# Patient Record
Sex: Male | Born: 1950 | Race: White | Hispanic: No | Marital: Single | Smoking: Former smoker
Health system: Southern US, Community
[De-identification: ages and names within clinical notes are randomized; demographics above are authoritative.]

## PROBLEM LIST (undated history)

## (undated) DIAGNOSIS — Z9889 Other specified postprocedural states: Secondary | ICD-10-CM

## (undated) DIAGNOSIS — N529 Male erectile dysfunction, unspecified: Secondary | ICD-10-CM

## (undated) DIAGNOSIS — K219 Gastro-esophageal reflux disease without esophagitis: Secondary | ICD-10-CM

## (undated) DIAGNOSIS — M1990 Unspecified osteoarthritis, unspecified site: Secondary | ICD-10-CM

## (undated) DIAGNOSIS — E291 Testicular hypofunction: Secondary | ICD-10-CM

## (undated) DIAGNOSIS — N138 Other obstructive and reflux uropathy: Secondary | ICD-10-CM

## (undated) DIAGNOSIS — N401 Enlarged prostate with lower urinary tract symptoms: Secondary | ICD-10-CM

## (undated) DIAGNOSIS — Z87442 Personal history of urinary calculi: Secondary | ICD-10-CM

## (undated) DIAGNOSIS — E559 Vitamin D deficiency, unspecified: Secondary | ICD-10-CM

## (undated) DIAGNOSIS — G709 Myoneural disorder, unspecified: Secondary | ICD-10-CM

## (undated) DIAGNOSIS — J45909 Unspecified asthma, uncomplicated: Secondary | ICD-10-CM

## (undated) HISTORY — PX: ANTERIOR RELEASE VERTEBRAL BODY W/ POSTERIOR FUSION: SUR290

## (undated) HISTORY — PX: APPENDECTOMY: SHX54

## (undated) HISTORY — PX: HERNIA REPAIR: SHX51

## (undated) HISTORY — DX: Male erectile dysfunction, unspecified: N52.9

## (undated) HISTORY — DX: Other obstructive and reflux uropathy: N13.8

## (undated) HISTORY — PX: NECK SURGERY: SHX720

## (undated) HISTORY — DX: Vitamin D deficiency, unspecified: E55.9

## (undated) HISTORY — PX: VARICOCELECTOMY: SHX1084

## (undated) HISTORY — PX: OTHER SURGICAL HISTORY: SHX169

## (undated) HISTORY — DX: Benign prostatic hyperplasia with lower urinary tract symptoms: N40.1

## (undated) HISTORY — DX: Testicular hypofunction: E29.1

---

## 2006-12-03 ENCOUNTER — Emergency Department: Payer: Self-pay | Admitting: Emergency Medicine

## 2006-12-03 ENCOUNTER — Other Ambulatory Visit: Payer: Self-pay

## 2007-02-17 ENCOUNTER — Ambulatory Visit: Payer: Self-pay | Admitting: Emergency Medicine

## 2010-06-26 HISTORY — PX: LEG SURGERY: SHX1003

## 2012-04-09 ENCOUNTER — Ambulatory Visit: Payer: Self-pay | Admitting: Surgery

## 2012-04-11 LAB — PATHOLOGY REPORT

## 2013-06-17 ENCOUNTER — Ambulatory Visit: Payer: Self-pay | Admitting: Family Medicine

## 2015-01-04 ENCOUNTER — Telehealth: Payer: Self-pay

## 2015-01-04 NOTE — Telephone Encounter (Signed)
Pt came into office wanting PA done. Nurse called insurance co and was able to get medication approved for 12/14/14-01/04/16. Pt was made aware. Cw,lpn

## 2015-05-04 ENCOUNTER — Other Ambulatory Visit: Payer: Self-pay

## 2015-05-04 ENCOUNTER — Encounter: Payer: Self-pay | Admitting: *Deleted

## 2015-05-04 DIAGNOSIS — E291 Testicular hypofunction: Secondary | ICD-10-CM

## 2015-05-04 DIAGNOSIS — R972 Elevated prostate specific antigen [PSA]: Secondary | ICD-10-CM

## 2015-05-05 LAB — PSA: PROSTATE SPECIFIC AG, SERUM: 3.3 ng/mL (ref 0.0–4.0)

## 2015-05-05 LAB — TESTOSTERONE: Testosterone: 343 ng/dL — ABNORMAL LOW (ref 348–1197)

## 2015-05-06 ENCOUNTER — Ambulatory Visit (INDEPENDENT_AMBULATORY_CARE_PROVIDER_SITE_OTHER): Payer: BC Managed Care – PPO | Admitting: Urology

## 2015-05-06 ENCOUNTER — Encounter: Payer: Self-pay | Admitting: Urology

## 2015-05-06 VITALS — BP 138/73 | HR 60 | Ht 68.0 in | Wt 204.8 lb

## 2015-05-06 DIAGNOSIS — N138 Other obstructive and reflux uropathy: Secondary | ICD-10-CM

## 2015-05-06 DIAGNOSIS — E291 Testicular hypofunction: Secondary | ICD-10-CM

## 2015-05-06 DIAGNOSIS — N401 Enlarged prostate with lower urinary tract symptoms: Secondary | ICD-10-CM

## 2015-05-06 DIAGNOSIS — N529 Male erectile dysfunction, unspecified: Secondary | ICD-10-CM | POA: Diagnosis not present

## 2015-05-06 MED ORDER — CIALIS 5 MG PO TABS: 5.0000 mg | ORAL_TABLET | Freq: Every day | ORAL | Status: DC

## 2015-05-06 MED ORDER — CIALIS 5 MG PO TABS
5.0000 mg | ORAL_TABLET | Freq: Every day | ORAL | Status: DC
Start: 2015-05-06 — End: 2015-05-06

## 2015-05-06 NOTE — Progress Notes (Signed)
05/06/2015 8:53 AM   Craig [name] 03-01-1951 [id number]  Referring provider: No referring provider defined for this encounter.  Chief Complaint  Patient presents with  . Benign Prostatic Hypertrophy    6 month follow up  . Erectile Dysfunction    HPI: Patient is a 64 year old white male with hypogonadism, ED and BPH with LUTS who presents today for a 6 months follow up.  Hypogonadism Patient presented with the symptoms of reduced libido and erectile dysfunction.   He is also experiencing a reduced incidence of spontaneous erections, an increase of body fat, a decrease in physical and work performance, disturbances in sleep patterns, decreased energy and motivation, a decrease in cognitive function and mood changes.   He had been on AndroGel in the past, but he felt no improvement in his symptoms. He does not want to start his testosterone therapy until his symptoms are much worse.  Erectile dysfunction His SHIM score is 24, which is no ED.   He has been having difficulty with erections for the last few years.   His major complaint is maintaining.  His libido is preserved.   His risk factors for ED are age and  BPH.  He denies any painful erections or curvatures with his erections.   He has tried PDE5-inhibitors in the past with good results.        SHIM      05/06/15 1430       SHIM: Over the last 6 months:   How do you rate your confidence that you could get and keep an erection? High     When you had erections with sexual stimulation, how often were your erections hard enough for penetration (entering your partner)? Almost Always or Always     During sexual intercourse, how often were you able to maintain your erection after you had penetrated (entered) your partner? Not Difficult     During sexual intercourse, how difficult was it to maintain your erection to completion of intercourse? Not Difficult     When you attempted sexual intercourse, how often was it satisfactory  for you? Not Difficult     SHIM Total Score   SHIM 24        Score: 1-7 Severe ED 8-11 Moderate ED 12-16 Mild-Moderate ED 17-21 Mild ED 22-25 No ED  BPH WITH LUTS His IPSS score today is 20, which is severe lower urinary tract symptomatology. He is mostly satisfied with his quality life due to his urinary symptoms.  His major complaint today getting older.  He has had these symptoms for last  year.  He denies any dysuria, hematuria or suprapubic pain.  He currently taking tamsulosin 0.4 mg daily.  He also denies any recent fevers, chills, nausea or vomiting.  He does not have a family history of PCa.      IPSS      05/06/15 1400       International Prostate Symptom Score   How often have you had the sensation of not emptying your bladder? More than half the time     How often have you had to urinate less than every two hours? Less than half the time     How often have you found you stopped and started again several times when you urinated? Less than half the time     How often have you found it difficult to postpone urination? Almost always     How often have you had a weak  urinary stream? More than half the time     How often have you had to strain to start urination? Not at All     How many times did you typically get up at night to urinate? 3 Times     Total IPSS Score 20     Quality of Life due to urinary symptoms   If you were to spend the rest of your life with your urinary condition just the way it is now how would you feel about that? Mostly Satisfied        Score:  1-7 Mild 8-19 Moderate 20-35 Severe    PMH: Past Medical History  Diagnosis Date  . Hypogonadism in male   . BPH with obstruction/lower urinary tract symptoms   . Vitamin D deficiency   . Erectile dysfunction     Surgical History: Past Surgical History  Procedure Laterality Date  . Anterior release vertebral body w/ posterior fusion    . Leg surgery Right 2012  . Appendectomy    .  Varicocelectomy    . Hernia repair  1975/1960  . Ruptured disc    . Neck surgery      Home Medications:    Medication List       This list is accurate as of: 05/06/15 11:59 PM.  Always use your most recent med list.               ANDROGEL PUMP 20.25 MG/ACT (1.62%) Gel  Generic drug:  Testosterone     CIALIS 5 MG tablet  Generic drug:  tadalafil  Take 1 tablet (5 mg total) by mouth daily.     ibuprofen 200 MG tablet  Commonly known as:  ADVIL,MOTRIN  Take 200 mg by mouth every 6 (six) hours as needed.     tamsulosin 0.4 MG Caps capsule  Commonly known as:  FLOMAX  Take by mouth.        Allergies:  Allergies  Allergen Reactions  . Other Other (See Comments)    IVP dye - SEVERE, Hospitalized, Profused Sweating    Family History: Family History  Problem Relation Age of Onset  . Kidney cancer Neg Hx   . Prostate cancer Neg Hx     Social History:  reports that he has quit smoking. He does not have any smokeless tobacco history on file. He reports that he drinks alcohol. He reports that he does not use illicit drugs.  ROS: UROLOGY Frequent Urination?: Yes Hard to postpone urination?: Yes Burning/pain with urination?: No Get up at night to urinate?: Yes Leakage of urine?: Yes Urine stream starts and stops?: No Trouble starting stream?: No Do you have to strain to urinate?: No Blood in urine?: No Urinary tract infection?: No Sexually transmitted disease?: No Injury to kidneys or bladder?: No Painful intercourse?: No Weak stream?: No Erection problems?: No Penile pain?: No  Gastrointestinal Nausea?: No Vomiting?: No Indigestion/heartburn?: No Diarrhea?: No Constipation?: No  Constitutional Fever: No Night sweats?: No Weight loss?: No Fatigue?: No  Skin Skin rash/lesions?: No Itching?: No  Eyes Blurred vision?: No Double vision?: No  Ears/Nose/Throat Sore throat?: No Sinus problems?: No  Hematologic/Lymphatic Swollen glands?:  No Easy bruising?: No  Cardiovascular Leg swelling?: No Chest pain?: No  Respiratory Cough?: No Shortness of breath?: No  Endocrine Excessive thirst?: No  Musculoskeletal Back pain?: No Joint pain?: No  Neurological Headaches?: No Dizziness?: No  Psychologic Depression?: No Anxiety?: No  Physical Exam: BP 138/73 mmHg  Pulse 60  Ht 5\' 8"  (1.727 m)  Wt 204 lb 12.8 oz (92.897 kg)  BMI 31.15 kg/m2  GU: No CVA tenderness.  No bladder fullness or masses.  Patient with circumcised phallus.   Urethral meatus is patent.  No penile discharge. No penile lesions or rashes. Scrotum without lesions, cysts, rashes and/or edema.  Testicles are located scrotally bilaterally. No masses are appreciated in the testicles. Left and right epididymis are normal. Rectal: Patient with  normal sphincter tone. Anus and perineum without scarring or rashes. No rectal masses are appreciated. Prostate is approximately 45 grams, no nodules are appreciated. Seminal vesicles are normal.   Laboratory Data: PSA History:  2.7 ng/mL on 11/04/2013  3.2 ng/mL on 04/06/2014  3.2 mg/mL on 11/05/2014  Lab Results  Component Value Date   PSA 3.3 05/04/2015    Lab Results  Component Value Date   TESTOSTERONE 343* 05/04/2015    Assessment & Plan:    1. Hypogonadism:   Patient had been on AndroGel therapy in the past, but he did not feel any improvement in his energy [name] or mood. He does not want to restart any testosterone therapy at this time.  2. Erectile dysfunction, unspecified erectile dysfunction type:   SHIM score is 24.  He is finding the Cialis 5 mg daily effective in treating his erectile dysfunction. I have sent a refill to his pharmacy. He will return to the office in 1 year for [facility] IM score and exam.  3. BPH (benign prostatic hyperplasia) with LUTS:   IPSS score is 20/2.  He is finding the Cialis 5 mg daily effective in treating his lower urinary tract symptoms. He contributes most of his  symptoms at this time to his increase in age and does not want to add additional medications or undergo any surgical procedure to improve his symptoms at this time. He'll return in 1 year for I PSS score, exam and PSA.   Return in about 1 year (around 05/05/2016) for IPSS and SHIM.  Zara Council, [facility] [address] [place],  [address] [phone]

## 2015-05-08 DIAGNOSIS — N138 Other obstructive and reflux uropathy: Secondary | ICD-10-CM | POA: Insufficient documentation

## 2015-05-08 DIAGNOSIS — N529 Male erectile dysfunction, unspecified: Secondary | ICD-10-CM | POA: Insufficient documentation

## 2015-05-08 DIAGNOSIS — N401 Enlarged prostate with lower urinary tract symptoms: Principal | ICD-10-CM

## 2016-01-20 ENCOUNTER — Telehealth: Payer: Self-pay

## 2016-01-20 NOTE — Telephone Encounter (Signed)
PA for cialis 5mg  was APPROVED for 29mo. Approval #- [id number]

## 2016-05-01 ENCOUNTER — Other Ambulatory Visit: Payer: Self-pay

## 2016-05-01 ENCOUNTER — Other Ambulatory Visit: Payer: Medicare Other

## 2016-05-01 ENCOUNTER — Other Ambulatory Visit: Payer: BC Managed Care – PPO

## 2016-05-01 DIAGNOSIS — N401 Enlarged prostate with lower urinary tract symptoms: Secondary | ICD-10-CM

## 2016-05-02 LAB — PSA: Prostate Specific Ag, Serum: 3.5 ng/mL (ref 0.0–4.0)

## 2016-05-08 ENCOUNTER — Ambulatory Visit (INDEPENDENT_AMBULATORY_CARE_PROVIDER_SITE_OTHER): Payer: Medicare Other | Admitting: Urology

## 2016-05-08 ENCOUNTER — Other Ambulatory Visit: Payer: Self-pay | Admitting: Family Medicine

## 2016-05-08 ENCOUNTER — Encounter: Payer: Self-pay | Admitting: Urology

## 2016-05-08 VITALS — BP 118/70 | HR 57 | Ht 68.0 in | Wt 202.9 lb

## 2016-05-08 DIAGNOSIS — N138 Other obstructive and reflux uropathy: Secondary | ICD-10-CM

## 2016-05-08 DIAGNOSIS — N401 Enlarged prostate with lower urinary tract symptoms: Secondary | ICD-10-CM

## 2016-05-08 DIAGNOSIS — N529 Male erectile dysfunction, unspecified: Secondary | ICD-10-CM | POA: Diagnosis not present

## 2016-05-08 MED ORDER — CIALIS 5 MG PO TABS: 5.0000 mg | ORAL_TABLET | Freq: Every day | ORAL | 4 refills | Status: DC

## 2016-05-08 NOTE — Progress Notes (Signed)
05/08/2016 2:05 PM   Craig [name] [birth year] [id number]  Referring provider: No referring provider defined for this encounter.  Chief Complaint  Patient presents with  . Benign Prostatic Hypertrophy    1 year follow up  . Erectile Dysfunction    HPI: Patient is a 65 year old Caucasian male with ED and BPH with LUTS who presents today for a 12 months follow up.  Erectile dysfunction His SHIM score is 19, which is mild ED.   His previous SHIM score is 24.  He has been having difficulty with erections for the last few years.   His major complaint is maintaining.  His libido is preserved.   His risk factors for ED are age and  BPH.  He denies any painful erections or curvatures with his erections.   He has tried PDE5-inhibitors in the past with good results.        SHIM    Row Name 05/08/16 1338         SHIM: Over the last 6 months:   How do you rate your confidence that you could get and keep an erection? Moderate     When you had erections with sexual stimulation, how often were your erections hard enough for penetration (entering your partner)? Most Times (much more than half the time)     During sexual intercourse, how often were you able to maintain your erection after you had penetrated (entered) your partner? Slightly Difficult     During sexual intercourse, how difficult was it to maintain your erection to completion of intercourse? Slightly Difficult     When you attempted sexual intercourse, how often was it satisfactory for you? Slightly Difficult       SHIM Total Score   SHIM 19        Score: 1-7 Severe ED 8-11 Moderate ED 12-16 Mild-Moderate ED 17-21 Mild ED 22-25 No ED  BPH WITH LUTS His IPSS score today is 18, which is moderate lower urinary tract symptomatology.  He is mixed with his quality life due to his urinary symptoms.  His PVR is His previous IPSS score was 20/2.  His major complaint today is taking longer to empty his bladder.  He has had  these symptoms for last  year.  He denies any dysuria, hematuria or suprapubic pain.  He currently taking tamsulosin 0.4 mg daily.  He also denies any recent fevers, chills, nausea or vomiting.  He does not have a family history of PCa.      IPSS    Row Name 05/08/16 1300         International Prostate Symptom Score   How often have you had the sensation of not emptying your bladder? About half the time     How often have you had to urinate less than every two hours? Less than half the time     How often have you found you stopped and started again several times when you urinated? Less than 1 in 5 times     How often have you found it difficult to postpone urination? More than half the time     How often have you had a weak urinary stream? Almost always     How often have you had to strain to start urination? Not at All     How many times did you typically get up at night to urinate? 3 Times     Total IPSS Score 18  Quality of Life due to urinary symptoms   If you were to spend the rest of your life with your urinary condition just the way it is now how would you feel about that? Mixed        Score:  1-7 Mild 8-19 Moderate 20-35 Severe  PMH: Past Medical History:  Diagnosis Date  . BPH with obstruction/lower urinary tract symptoms   . Erectile dysfunction   . Hypogonadism in male   . Vitamin D deficiency     Surgical History: Past Surgical History:  Procedure Laterality Date  . ANTERIOR RELEASE VERTEBRAL BODY W/ POSTERIOR FUSION    . APPENDECTOMY    . HERNIA REPAIR  1975/1960  . LEG SURGERY Right 2012  . NECK SURGERY    . ruptured disc    . VARICOCELECTOMY      Home Medications:    Medication List       Accurate as of 05/08/16  2:05 PM. Always use your most recent med list.          CIALIS 5 MG tablet Generic drug:  tadalafil Take 1 tablet (5 mg total) by mouth daily.   ibuprofen 200 MG tablet Commonly known as:  ADVIL,MOTRIN Take 200 mg by mouth  3 (three) times daily.   tamsulosin 0.4 MG Caps capsule Commonly known as:  FLOMAX Take by mouth.   Testosterone 20.25 MG/ACT (1.62%) Gel Reported on 11/05/2015       Allergies:  Allergies  Allergen Reactions  . Other Other (See Comments)    IVP dye - SEVERE, Hospitalized, Profused Sweating    Family History: Family History  Problem Relation Age of Onset  . Kidney cancer Neg Hx   . Prostate cancer Neg Hx   . Kidney disease Neg Hx     Social History:  reports that he has quit smoking. He has never used smokeless tobacco. He reports that he drinks alcohol. He reports that he does not use drugs.  ROS: UROLOGY Frequent Urination?: Yes Hard to postpone urination?: Yes Burning/pain with urination?: No Get up at night to urinate?: Yes Leakage of urine?: Yes Urine stream starts and stops?: No Trouble starting stream?: No Do you have to strain to urinate?: No Blood in urine?: No Urinary tract infection?: No Sexually transmitted disease?: No Injury to kidneys or bladder?: No Painful intercourse?: No Weak stream?: No Erection problems?: No Penile pain?: No  Gastrointestinal Nausea?: No Vomiting?: No Indigestion/heartburn?: No Diarrhea?: No Constipation?: No  Constitutional Fever: No Night sweats?: No Weight loss?: No Fatigue?: No  Skin Skin rash/lesions?: No Itching?: No  Eyes Blurred vision?: No Double vision?: No  Ears/Nose/Throat Sore throat?: No Sinus problems?: No  Hematologic/Lymphatic Swollen glands?: No Easy bruising?: No  Cardiovascular Leg swelling?: No Chest pain?: No  Respiratory Cough?: No Shortness of breath?: No  Endocrine Excessive thirst?: No  Musculoskeletal Back pain?: Yes Joint pain?: Yes  Neurological Headaches?: No Dizziness?: No  Psychologic Depression?: No Anxiety?: No  Physical Exam: BP 118/70   Pulse (!) 57   Ht 5\' 8"  (1.727 m)   Wt 202 lb 14.4 oz (92 kg)   BMI 30.85 kg/m   GU: No CVA tenderness.   No bladder fullness or masses.  Patient with circumcised phallus.   Urethral meatus is patent.  No penile discharge. No penile lesions or rashes. Scrotum without lesions, cysts, rashes and/or edema.  Testicles are located scrotally bilaterally. No masses are appreciated in the testicles. Left and right epididymis are normal. Rectal: Patient  with  normal sphincter tone. Anus and perineum without scarring or rashes. No rectal masses are appreciated. Prostate is approximately 45 grams, no nodules are appreciated. Seminal vesicles are normal.   Laboratory Data: PSA History:  2.7 ng/mL on 11/04/2013  3.2 ng/mL on 04/06/2014  3.2 ng/mL on 11/05/2014  3.5 ng/mL on 05/01/2016   Lab Results  Component Value Date   TESTOSTERONE 343 (L) 05/04/2015    Assessment & Plan:    1. Erectile dysfunction  - SHIM score is 19, it is worsening  - I explained to the patient that in order to achieve an erection it takes good functioning of the nervous system (parasympathetic, sympathetic, sensory and motor), good blood flow into the erectile tissue of the penis and a desire to have sex  - I explained that conditions like diabetes, hypertension, coronary artery disease, peripheral vascular disease, smoking, alcohol consumption, age, sleep apnea and BPH can diminish the ability to have an erection  - Continue Cialis  - RTC in 12 months for repeat SHIM score and exam   2. BPH with LUTS  - IPSS score is 18/3, it is worsening  - Continue conservative management, avoiding bladder irritants and timed voiding's  - Continue Cialis 5 mg daily; refill given   - RTC in 12 months for IPSS, PSA and exam  Return in about 1 year (around 05/08/2017) for IPSS, SHIM, PSA and exam.  Zara Council, [facility]  [place] [address], [place], [place] [address] [phone]

## 2016-05-15 ENCOUNTER — Telehealth: Payer: Self-pay

## 2016-05-15 NOTE — Telephone Encounter (Signed)
PA for Cialis was DENIED!

## 2016-11-15 DIAGNOSIS — M129 Arthropathy, unspecified: Secondary | ICD-10-CM | POA: Insufficient documentation

## 2016-11-15 DIAGNOSIS — K219 Gastro-esophageal reflux disease without esophagitis: Secondary | ICD-10-CM | POA: Insufficient documentation

## 2017-05-10 NOTE — Progress Notes (Signed)
05/11/2017 9:03 AM   Craig [name] Aug 09, 1950 [id number]  Referring provider: No referring provider defined for this encounter.  Chief Complaint  Patient presents with  . Benign Prostatic Hypertrophy    HPI: Patient is a 66 year old Caucasian male with ED and BPH with LUTS who presents today for a 12 months follow up.  Erectile dysfunction His SHIM score is 20, which is mild ED.   His previous SHIM score is 19.  He has been having difficulty with erections for the last few years.   His major complaint is maintaining.  His libido is preserved.   His risk factors for ED are age and  BPH.  He denies any painful erections or curvatures with his erections.   He has tried PDE5-inhibitors in the past with good results.  He has discontinued his Cialis due to cost.   His testosterone [name] and TSH are normal.   SHIM    Row Name 05/11/17 0847         SHIM: Over the last 6 months:   How do you rate your confidence that you could get and keep an erection?  Moderate     When you had erections with sexual stimulation, how often were your erections hard enough for penetration (entering your partner)?  Most Times (much more than half the time)     During sexual intercourse, how often were you able to maintain your erection after you had penetrated (entered) your partner?  Most Times (much more than half the time)     During sexual intercourse, how difficult was it to maintain your erection to completion of intercourse?  Not Difficult     When you attempted sexual intercourse, how often was it satisfactory for you?  Most Times (much more than half the time)       SHIM Total Score   SHIM  20        Score: 1-7 Severe ED 8-11 Moderate ED 12-16 Mild-Moderate ED 17-21 Mild ED 22-25 No ED  BPH WITH LUTS His IPSS score today is 25, which is severe lower urinary tract symptomatology.   He is mixed with his quality life due to his urinary symptoms.  His PVR is His previous IPSS score was 18/3.   His major complaints today are frequency, urgency, nocturia, incontinence (post void), intermittency and a weak stream.  He has had these symptoms for last  year.  He denies any dysuria, hematuria or suprapubic pain.  He has discontinued his tamsulosin 0.4 mg daily and is taking super beta prostate and ageless male.  He also denies any recent fevers, chills, nausea or vomiting.  He does not have a family history of PCa. IPSS    Row Name 05/11/17 0800         International Prostate Symptom Score   How often have you had the sensation of not emptying your bladder?  More than half the time     How often have you had to urinate less than every two hours?  About half the time     How often have you found you stopped and started again several times when you urinated?  Almost always     How often have you found it difficult to postpone urination?  Almost always     How often have you had a weak urinary stream?  Almost always     How often have you had to strain to start urination?  Less than 1 in  5 times     How many times did you typically get up at night to urinate?  2 Times     Total IPSS Score  25       Quality of Life due to urinary symptoms   If you were to spend the rest of your life with your urinary condition just the way it is now how would you feel about that?  Mixed        Score:  1-7 Mild 8-19 Moderate 20-35 Severe  PMH: Past Medical History:  Diagnosis Date  . BPH with obstruction/lower urinary tract symptoms   . Erectile dysfunction   . Hypogonadism in male   . Vitamin D deficiency     Surgical History: Past Surgical History:  Procedure Laterality Date  . ANTERIOR RELEASE VERTEBRAL BODY W/ POSTERIOR FUSION    . APPENDECTOMY    . HERNIA REPAIR  1975/1960  . LEG SURGERY Right 2012  . NECK SURGERY    . ruptured disc    . VARICOCELECTOMY      Home Medications:  Allergies as of 05/11/2017      Reactions   Ivp Dye [iodinated Diagnostic Agents] Anaphylaxis        Medication List        Accurate as of 05/11/17  9:03 AM. Always use your most recent med list.          ibuprofen 200 MG tablet Commonly known as:  ADVIL,MOTRIN Take 200 mg by mouth 3 (three) times daily.   omeprazole 40 MG capsule Commonly known as:  PRILOSEC Take 40 mg daily by mouth.   sildenafil 20 MG tablet Commonly known as:  REVATIO Take 3 to 5 tablets two hours before intercouse on an empty stomach.  Do not take with nitrates.   tadalafil 5 MG tablet Commonly known as:  CIALIS Take 1 tablet (5 mg total) daily as needed by mouth for erectile dysfunction.   UNABLE TO FIND Take 1 capsule 2 (two) times daily by mouth. Med Name: Super Beta Prostate   UNABLE TO FIND Take 1 capsule 2 (two) times daily by mouth. Med Name: Ageless Male       Allergies:  Allergies  Allergen Reactions  . Ivp Dye [Iodinated Diagnostic Agents] Anaphylaxis    Family History: Family History  Problem Relation Age of Onset  . Kidney cancer Neg Hx   . Prostate cancer Neg Hx   . Kidney disease Neg Hx     Social History:  reports that he has quit smoking. he has never used smokeless tobacco. He reports that he drinks alcohol. He reports that he does not use drugs.  ROS: UROLOGY Frequent Urination?: Yes Hard to postpone urination?: Yes Burning/pain with urination?: No Get up at night to urinate?: Yes Leakage of urine?: Yes Urine stream starts and stops?: Yes Trouble starting stream?: No Do you have to strain to urinate?: No Blood in urine?: No Urinary tract infection?: No Sexually transmitted disease?: No Injury to kidneys or bladder?: No Painful intercourse?: No Weak stream?: Yes Erection problems?: No Penile pain?: No  Gastrointestinal Nausea?: No Vomiting?: No Indigestion/heartburn?: No Diarrhea?: No Constipation?: No  Constitutional Fever: No Night sweats?: No Weight loss?: No Fatigue?: No  Skin Skin rash/lesions?: No Itching?: No  Eyes Blurred vision?:  No Double vision?: No  Ears/Nose/Throat Sore throat?: No Sinus problems?: No  Hematologic/Lymphatic Swollen glands?: No Easy bruising?: No  Cardiovascular Leg swelling?: No Chest pain?: No  Respiratory Cough?: No Shortness of breath?:  No  Endocrine Excessive thirst?: No  Musculoskeletal Back pain?: Yes Joint pain?: Yes  Neurological Headaches?: No Dizziness?: No  Psychologic Depression?: No Anxiety?: No  Physical Exam: BP (!) 153/86   Pulse 61   Ht 5\' 8"  (1.727 m)   Wt 207 lb (93.9 kg)   BMI 31.47 kg/m   GU: No CVA tenderness.  No bladder fullness or masses.  Patient with circumcised phallus.   Urethral meatus is patent.  No penile discharge. No penile lesions or rashes. Scrotum without lesions, cysts, rashes and/or edema.  Testicles are located scrotally bilaterally. No masses are appreciated in the testicles. Left and right epididymis are normal. Rectal: Patient with  normal sphincter tone. Anus and perineum without scarring or rashes. No rectal masses are appreciated. Prostate is approximately 45 grams, no nodules are appreciated. Seminal vesicles are normal.   Laboratory Data: PSA History:  2.7 ng/mL on 11/04/2013  3.2 ng/mL on 04/06/2014  3.2 ng/mL on 11/05/2014  3.5 ng/mL on 05/01/2016  3.83 ng/mL on 05/11/2017  I have reviewed the labs.   Assessment & Plan:    1. Erectile dysfunction  - SHIM score is 20, it is mildly improved  - I explained to the patient that in order to achieve an erection it takes good functioning of the nervous system (parasympathetic, sympathetic, sensory and motor), good blood flow into the erectile tissue of the penis and a desire to have sex  - I explained that conditions like diabetes, hypertension, coronary artery disease, peripheral vascular disease, smoking, alcohol consumption, age, sleep apnea and BPH can diminish the ability to have an erection  - prescribed Sildenafil 20 mg, 3 to 5 tablets two hours prior to  intercourse on an empty stomach, # 8; he is warned not to take medications that contain nitrates.  I also advised him of the side effects, such as: headache, flushing, dyspepsia, abnormal vision, nasal congestion, back pain, myalgia, nausea, dizziness, and rash.  - RTC in 12 months for repeat SHIM score and exam   2. BPH with LUTS  - IPSS score is 25/3, it is worsening  - Continue conservative management, avoiding bladder irritants and timed voiding's  - Cialis 5 mg daily was cost prohibitive, will prescribe again to see what may need to be done to get it approved; could not tolerate the side effects of the alpha-blockers   - he is not interested in a bladder outlet procedure at this time  - RTC in 12 months for IPSS, PSA and exam  Return in about 1 year (around 05/11/2018) for IPSS, SHIM, PSA and exam.  Zara Council, [facility]  [facility] [address] [place], [place] [address] [phone]

## 2017-05-11 ENCOUNTER — Ambulatory Visit (INDEPENDENT_AMBULATORY_CARE_PROVIDER_SITE_OTHER): Payer: Medicare Other | Admitting: Urology

## 2017-05-11 ENCOUNTER — Other Ambulatory Visit
Admission: RE | Admit: 2017-05-11 | Discharge: 2017-05-11 | Disposition: A | Discharge status: Home / Self Care | Payer: Medicare Other | Source: Ambulatory Visit | Attending: Urology | Admitting: Urology

## 2017-05-11 ENCOUNTER — Encounter: Payer: Self-pay | Admitting: Urology

## 2017-05-11 VITALS — BP 153/86 | HR 61 | Ht 68.0 in | Wt 207.0 lb

## 2017-05-11 DIAGNOSIS — N138 Other obstructive and reflux uropathy: Secondary | ICD-10-CM

## 2017-05-11 DIAGNOSIS — N401 Enlarged prostate with lower urinary tract symptoms: Secondary | ICD-10-CM | POA: Diagnosis present

## 2017-05-11 DIAGNOSIS — N529 Male erectile dysfunction, unspecified: Secondary | ICD-10-CM | POA: Diagnosis not present

## 2017-05-11 LAB — PSA: Prostatic Specific Antigen: 3.83 ng/mL (ref 0.00–4.00)

## 2017-05-11 MED ORDER — TADALAFIL 5 MG PO TABS: 5.0000 mg | ORAL_TABLET | Freq: Every day | ORAL | 3 refills | Status: DC | PRN

## 2017-05-11 MED ORDER — SILDENAFIL CITRATE 20 MG PO TABS: ORAL_TABLET | ORAL | 3 refills | Status: DC

## 2017-05-12 LAB — TESTOSTERONE: Testosterone: 374 ng/dL (ref 264–916)

## 2017-05-14 ENCOUNTER — Telehealth: Payer: Self-pay

## 2017-05-14 NOTE — Telephone Encounter (Signed)
Will send a letter

## 2017-05-14 NOTE — Telephone Encounter (Signed)
-----   Message from Nori Riis, PA-C sent at 05/13/2017  8:25 PM EST ----- Please let Mr. Stash know that his testosterone and PSA are normal.

## 2017-08-20 NOTE — Discharge Instructions (Signed)

## 2017-08-21 ENCOUNTER — Ambulatory Visit
Admission: RE | Admit: 2017-08-21 | Discharge: 2017-08-21 | Disposition: A | Discharge status: Home / Self Care | Payer: Medicare Other | Source: Ambulatory Visit | Attending: Ophthalmology | Admitting: Ophthalmology

## 2017-08-21 ENCOUNTER — Encounter
Admission: RE | Disposition: A | Discharge status: Home / Self Care | Payer: Self-pay | Source: Ambulatory Visit | Attending: Ophthalmology

## 2017-08-21 ENCOUNTER — Ambulatory Visit: Payer: Medicare Other | Admitting: Anesthesiology

## 2017-08-21 ENCOUNTER — Encounter: Payer: Self-pay | Admitting: Ophthalmology

## 2017-08-21 DIAGNOSIS — Z791 Long term (current) use of non-steroidal anti-inflammatories (NSAID): Secondary | ICD-10-CM | POA: Insufficient documentation

## 2017-08-21 DIAGNOSIS — K219 Gastro-esophageal reflux disease without esophagitis: Secondary | ICD-10-CM | POA: Diagnosis not present

## 2017-08-21 DIAGNOSIS — Z87891 Personal history of nicotine dependence: Secondary | ICD-10-CM | POA: Diagnosis not present

## 2017-08-21 DIAGNOSIS — H2511 Age-related nuclear cataract, right eye: Secondary | ICD-10-CM | POA: Diagnosis present

## 2017-08-21 DIAGNOSIS — Z79899 Other long term (current) drug therapy: Secondary | ICD-10-CM | POA: Insufficient documentation

## 2017-08-21 HISTORY — PX: CATARACT EXTRACTION W/PHACO: SHX586

## 2017-08-21 HISTORY — DX: Gastro-esophageal reflux disease without esophagitis: K21.9

## 2017-08-21 HISTORY — DX: Myoneural disorder, unspecified: G70.9

## 2017-08-21 SURGERY — PHACOEMULSIFICATION, CATARACT, WITH IOL INSERTION
Anesthesia: Monitor Anesthesia Care | Site: Eye | Laterality: Right | Wound class: Clean

## 2017-08-21 MED ORDER — SODIUM HYALURONATE 10 MG/ML IO SOLN
INTRAOCULAR | Status: DC | PRN
  Administered 2017-08-21: 0.55 mL via INTRAOCULAR

## 2017-08-21 MED ORDER — MIDAZOLAM HCL 2 MG/2ML IJ SOLN
INTRAMUSCULAR | Status: DC | PRN
  Administered 2017-08-21: 2 mg via INTRAVENOUS

## 2017-08-21 MED ORDER — MOXIFLOXACIN HCL 0.5 % OP SOLN
OPHTHALMIC | Status: DC | PRN
  Administered 2017-08-21: 0.2 mL via OPHTHALMIC

## 2017-08-21 MED ORDER — ACETAMINOPHEN 325 MG PO TABS
325.0000 mg | ORAL_TABLET | Freq: Once | ORAL | Status: DC
Start: 2017-08-21 — End: 2017-08-21

## 2017-08-21 MED ORDER — ACETAMINOPHEN 160 MG/5ML PO SOLN: 325.0000 mg | Freq: Once | ORAL | Status: DC

## 2017-08-21 MED ORDER — LACTATED RINGERS IV SOLN: INTRAVENOUS | Status: DC

## 2017-08-21 MED ORDER — BSS IO SOLN
INTRAOCULAR | Status: DC | PRN
  Administered 2017-08-21: 96 mL via OPHTHALMIC

## 2017-08-21 MED ORDER — BALANCED SALT IO SOLN
INTRAOCULAR | Status: DC | PRN
  Administered 2017-08-21: 2 mL via INTRAOCULAR

## 2017-08-21 MED ORDER — FENTANYL CITRATE (PF) 100 MCG/2ML IJ SOLN
INTRAMUSCULAR | Status: DC | PRN
  Administered 2017-08-21: 50 ug via INTRAVENOUS

## 2017-08-21 MED ORDER — ARMC OPHTHALMIC DILATING DROPS
1.0000 "application " | OPHTHALMIC | Status: DC | PRN
  Administered 2017-08-21 (×3): 1 via OPHTHALMIC

## 2017-08-21 MED ORDER — SODIUM HYALURONATE 23 MG/ML IO SOLN
INTRAOCULAR | Status: DC | PRN
  Administered 2017-08-21: 0.6 mL via INTRAOCULAR

## 2017-08-21 SURGICAL SUPPLY — 16 items
CANNULA ANT/CHMB 27GA (MISCELLANEOUS) ×3 IMPLANT
DISSECTOR HYDRO NUCLEUS 50X22 (MISCELLANEOUS) ×3 IMPLANT
GLOVE BIO SURGEON STRL SZ8 (GLOVE) ×3 IMPLANT
GLOVE SURG LX 7.5 STRW (GLOVE) ×2
GLOVE SURG LX STRL 7.5 STRW (GLOVE) ×1 IMPLANT
GOWN STRL REUS W/ TWL LRG LVL3 (GOWN DISPOSABLE) ×2 IMPLANT
GOWN STRL REUS W/TWL LRG LVL3 (GOWN DISPOSABLE) ×4
LENS IOL TECNIS ITEC 19.5 (Intraocular Lens) ×3 IMPLANT
MARKER SKIN DUAL TIP RULER LAB (MISCELLANEOUS) ×3 IMPLANT
PACK CATARACT (MISCELLANEOUS) ×3 IMPLANT
PACK DR. KING ARMS (PACKS) ×3 IMPLANT
PACK EYE AFTER SURG (MISCELLANEOUS) ×3 IMPLANT
SYR 3ML LL SCALE MARK (SYRINGE) ×3 IMPLANT
SYR TB 1ML LUER SLIP (SYRINGE) ×3 IMPLANT
WATER STERILE IRR 500ML POUR (IV SOLUTION) ×3 IMPLANT
WIPE NON LINTING 3.25X3.25 (MISCELLANEOUS) ×3 IMPLANT

## 2017-08-21 NOTE — Anesthesia Preprocedure Evaluation (Signed)
Anesthesia Evaluation  Patient identified by MRN, date of birth, ID band Patient awake    Reviewed: Allergy & Precautions, H&P , NPO status , Patient's Chart, lab work & pertinent test results  Airway Mallampati: II  TM Distance: >3 FB Neck ROM: full    Dental no notable dental hx.    Pulmonary former smoker,    Pulmonary exam normal breath sounds clear to auscultation       Cardiovascular Normal cardiovascular exam Rhythm:regular Rate:Normal     Neuro/Psych    GI/Hepatic GERD  ,  Endo/Other    Renal/GU      Musculoskeletal   Abdominal   Peds  Hematology   Anesthesia Other Findings   Reproductive/Obstetrics                             Anesthesia Physical Anesthesia Plan  ASA: II  Anesthesia Plan: MAC   Post-op Pain Management:    Induction:   PONV Risk Score and Plan: 1 and Treatment may vary due to age or medical condition and Midazolam  Airway Management Planned:   Additional Equipment:   Intra-op Plan:   Post-operative Plan:   Informed Consent: I have reviewed the patients History and Physical, chart, labs and discussed the procedure including the risks, benefits and alternatives for the proposed anesthesia with the patient or authorized representative who has indicated his/her understanding and acceptance.     Plan Discussed with: CRNA  Anesthesia Plan Comments:         Anesthesia Quick Evaluation

## 2017-08-21 NOTE — Op Note (Signed)
OPERATIVE NOTE  GIANNI MIHALIK [id number] 08/21/2017   PREOPERATIVE DIAGNOSIS:  Nuclear sclerotic cataract right eye.  H25.11   POSTOPERATIVE DIAGNOSIS:    Nuclear sclerotic cataract right eye.     PROCEDURE:  Phacoemusification with posterior chamber intraocular lens placement of the right eye   LENS:   Implant Name Type Inv. Item Serial No. Manufacturer Lot No. LRB No. Used  LENS IOL DIOP 19.5 - [id number] Intraocular Lens LENS IOL DIOP 19.5 [id number] AMO  Right 1       PCB00 +19.5   ULTRASOUND TIME: 0 minutes 29.1 seconds.  CDE 4.71   SURGEON:  Benay Pillow, MD, MPH  ANESTHESIOLOGIST: Anesthesiologist: Ronelle Nigh, MD CRNA: Janna Arch, CRNA   ANESTHESIA:  Topical with tetracaine drops augmented with 1% preservative-free intracameral lidocaine.  ESTIMATED BLOOD LOSS: less than 1 mL.   COMPLICATIONS:  None.   DESCRIPTION OF PROCEDURE:  The patient was identified in the holding room and transported to the operating room and placed in the supine position under the operating microscope.  The right eye was identified as the operative eye and it was prepped and draped in the usual sterile ophthalmic fashion.   A 1.0 millimeter clear-corneal paracentesis was made at the 10:30 position. 0.5 ml of preservative-free 1% lidocaine with epinephrine was injected into the anterior chamber.  The anterior chamber was filled with Healon 5 viscoelastic.  A 2.4 millimeter keratome was used to make a near-clear corneal incision at the 8:00 position.  A curvilinear capsulorrhexis was made with a cystotome and capsulorrhexis forceps.  Balanced salt solution was used to hydrodissect and hydrodelineate the nucleus.   Phacoemulsification was then used in stop and chop fashion to remove the lens nucleus and epinucleus.  The remaining cortex was then removed using the irrigation and aspiration handpiece. Healon was then placed into the capsular bag to distend it for lens placement.  A lens  was then injected into the capsular bag.  The remaining viscoelastic was aspirated.   Wounds were hydrated with balanced salt solution.  The anterior chamber was inflated to a physiologic pressure with balanced salt solution.   Intracameral vigamox 0.1 mL undiluted was injected into the eye and a drop placed onto the ocular surface.  No wound leaks were noted.  The patient was taken to the recovery room in stable condition without complications of anesthesia or surgery  Benay Pillow 08/21/2017, 11:12 AM

## 2017-08-21 NOTE — Transfer of Care (Signed)
Immediate Anesthesia Transfer of Care Note  Patient: Craig [name]  Procedure(s) Performed: CATARACT EXTRACTION PHACO AND INTRAOCULAR LENS PLACEMENT (IOC) RIGHT (Right Eye)  Patient Location: PACU  Anesthesia Type: MAC  Level of Consciousness: awake, alert  and patient cooperative  Airway and Oxygen Therapy: Patient Spontanous Breathing and Patient connected to supplemental oxygen  Post-op Assessment: Post-op Vital signs reviewed, Patient's Cardiovascular Status Stable, Respiratory Function Stable, Patent Airway and No signs of Nausea or vomiting  Post-op Vital Signs: Reviewed and stable  Complications: No apparent anesthesia complications

## 2017-08-21 NOTE — Anesthesia Procedure Notes (Signed)
Procedure Name: MAC Date/Time: 08/21/2017 10:47 AM Performed by: Janna Arch, CRNA Pre-anesthesia Checklist: Patient identified, Emergency Drugs available, Suction available and Patient being monitored Patient Re-evaluated:Patient Re-evaluated prior to induction Oxygen Delivery Method: Nasal cannula

## 2017-08-21 NOTE — H&P (Signed)
The History and Physical notes are on paper, have been signed, and are to be scanned.   I have examined the patient and there are no changes to the H&P.   Benay Pillow 08/21/2017 10:39 AM

## 2017-08-21 NOTE — Anesthesia Postprocedure Evaluation (Signed)
Anesthesia Post Note  Patient: Craig [name]  Procedure(s) Performed: CATARACT EXTRACTION PHACO AND INTRAOCULAR LENS PLACEMENT (IOC) RIGHT (Right Eye)  Patient location during evaluation: PACU Anesthesia Type: MAC Level of consciousness: awake and alert and oriented Pain management: satisfactory to patient Vital Signs Assessment: post-procedure vital signs reviewed and stable Respiratory status: spontaneous breathing, nonlabored ventilation and respiratory function stable Cardiovascular status: blood pressure returned to baseline and stable Postop Assessment: Adequate PO intake and No signs of nausea or vomiting Anesthetic complications: no    Raliegh Ip

## 2017-09-13 NOTE — Discharge Instructions (Signed)

## 2017-09-18 ENCOUNTER — Encounter
Admission: RE | Disposition: A | Discharge status: Home / Self Care | Payer: Self-pay | Source: Ambulatory Visit | Attending: Ophthalmology

## 2017-09-18 ENCOUNTER — Ambulatory Visit
Admission: RE | Admit: 2017-09-18 | Discharge: 2017-09-18 | Disposition: A | Discharge status: Home / Self Care | Payer: Medicare Other | Source: Ambulatory Visit | Attending: Ophthalmology | Admitting: Ophthalmology

## 2017-09-18 ENCOUNTER — Ambulatory Visit: Payer: Medicare Other | Admitting: Anesthesiology

## 2017-09-18 DIAGNOSIS — Z87891 Personal history of nicotine dependence: Secondary | ICD-10-CM | POA: Diagnosis not present

## 2017-09-18 DIAGNOSIS — K219 Gastro-esophageal reflux disease without esophagitis: Secondary | ICD-10-CM | POA: Insufficient documentation

## 2017-09-18 DIAGNOSIS — J45909 Unspecified asthma, uncomplicated: Secondary | ICD-10-CM | POA: Insufficient documentation

## 2017-09-18 DIAGNOSIS — Z79899 Other long term (current) drug therapy: Secondary | ICD-10-CM | POA: Diagnosis not present

## 2017-09-18 DIAGNOSIS — N400 Enlarged prostate without lower urinary tract symptoms: Secondary | ICD-10-CM | POA: Diagnosis not present

## 2017-09-18 DIAGNOSIS — R0683 Snoring: Secondary | ICD-10-CM | POA: Insufficient documentation

## 2017-09-18 DIAGNOSIS — Z91041 Radiographic dye allergy status: Secondary | ICD-10-CM | POA: Diagnosis not present

## 2017-09-18 DIAGNOSIS — H2512 Age-related nuclear cataract, left eye: Secondary | ICD-10-CM | POA: Insufficient documentation

## 2017-09-18 HISTORY — PX: CATARACT EXTRACTION W/PHACO: SHX586

## 2017-09-18 SURGERY — PHACOEMULSIFICATION, CATARACT, WITH IOL INSERTION
Anesthesia: Monitor Anesthesia Care | Site: Eye | Laterality: Left | Wound class: Clean

## 2017-09-18 MED ORDER — SODIUM HYALURONATE 10 MG/ML IO SOLN
INTRAOCULAR | Status: DC | PRN
  Administered 2017-09-18: 0.55 mL via INTRAOCULAR

## 2017-09-18 MED ORDER — ACETAMINOPHEN 325 MG PO TABS: 650.0000 mg | ORAL_TABLET | Freq: Once | ORAL | Status: DC | PRN

## 2017-09-18 MED ORDER — BSS IO SOLN
INTRAOCULAR | Status: DC | PRN
  Administered 2017-09-18: 67 mL via OPHTHALMIC

## 2017-09-18 MED ORDER — FENTANYL CITRATE (PF) 100 MCG/2ML IJ SOLN
INTRAMUSCULAR | Status: DC | PRN
  Administered 2017-09-18: 100 ug via INTRAVENOUS

## 2017-09-18 MED ORDER — SODIUM HYALURONATE 23 MG/ML IO SOLN
INTRAOCULAR | Status: DC | PRN
  Administered 2017-09-18: 0.6 mL via INTRAOCULAR

## 2017-09-18 MED ORDER — LIDOCAINE HCL (PF) 2 % IJ SOLN
INTRAOCULAR | Status: DC | PRN
  Administered 2017-09-18: 1 mL via INTRAOCULAR

## 2017-09-18 MED ORDER — MIDAZOLAM HCL 2 MG/2ML IJ SOLN
INTRAMUSCULAR | Status: DC | PRN
  Administered 2017-09-18: 2 mg via INTRAVENOUS

## 2017-09-18 MED ORDER — ACETAMINOPHEN 160 MG/5ML PO SOLN: 325.0000 mg | ORAL | Status: DC | PRN

## 2017-09-18 MED ORDER — ONDANSETRON HCL 4 MG/2ML IJ SOLN: 4.0000 mg | Freq: Once | INTRAMUSCULAR | Status: DC | PRN

## 2017-09-18 MED ORDER — ARMC OPHTHALMIC DILATING DROPS
1.0000 "application " | OPHTHALMIC | Status: DC | PRN
  Administered 2017-09-18 (×3): 1 via OPHTHALMIC

## 2017-09-18 MED ORDER — LACTATED RINGERS IV SOLN: INTRAVENOUS | Status: DC

## 2017-09-18 MED ORDER — MOXIFLOXACIN HCL 0.5 % OP SOLN
OPHTHALMIC | Status: DC | PRN
  Administered 2017-09-18: 0.2 mL

## 2017-09-18 SURGICAL SUPPLY — 16 items
CANNULA ANT/CHMB 27GA (MISCELLANEOUS) ×3 IMPLANT
DISSECTOR HYDRO NUCLEUS 50X22 (MISCELLANEOUS) ×3 IMPLANT
GLOVE BIO SURGEON STRL SZ8 (GLOVE) ×3 IMPLANT
GLOVE SURG LX 7.5 STRW (GLOVE) ×2
GLOVE SURG LX STRL 7.5 STRW (GLOVE) ×1 IMPLANT
GOWN STRL REUS W/ TWL LRG LVL3 (GOWN DISPOSABLE) ×2 IMPLANT
GOWN STRL REUS W/TWL LRG LVL3 (GOWN DISPOSABLE) ×4
LENS IOL TECNIS ITEC 19.5 (Intraocular Lens) ×3 IMPLANT
MARKER SKIN DUAL TIP RULER LAB (MISCELLANEOUS) ×3 IMPLANT
PACK CATARACT (MISCELLANEOUS) ×3 IMPLANT
PACK DR. KING ARMS (PACKS) ×3 IMPLANT
PACK EYE AFTER SURG (MISCELLANEOUS) ×3 IMPLANT
SYR 3ML LL SCALE MARK (SYRINGE) ×3 IMPLANT
SYR TB 1ML LUER SLIP (SYRINGE) ×3 IMPLANT
WATER STERILE IRR 500ML POUR (IV SOLUTION) ×3 IMPLANT
WIPE NON LINTING 3.25X3.25 (MISCELLANEOUS) ×3 IMPLANT

## 2017-09-18 NOTE — Op Note (Signed)
OPERATIVE NOTE  JUN OSMENT [id number] 09/18/2017   PREOPERATIVE DIAGNOSIS:  Nuclear sclerotic cataract left eye.  H25.12   POSTOPERATIVE DIAGNOSIS:    Nuclear sclerotic cataract left eye.     PROCEDURE:  Phacoemusification with posterior chamber intraocular lens placement of the left eye   LENS:   Implant Name Type Inv. Item Serial No. Manufacturer Lot No. LRB No. Used  LENS IOL DIOP 19.5 - [id number] Intraocular Lens LENS IOL DIOP 19.5 [id number] AMO  Left 1       PCB00 +19.5   ULTRASOUND TIME: 0 minutes 25 seconds.  CDE 2.49   SURGEON:  Benay Pillow, MD, MPH   ANESTHESIA:  Topical with tetracaine drops augmented with 1% preservative-free intracameral lidocaine.  ESTIMATED BLOOD LOSS: <1 mL   COMPLICATIONS:  None.   DESCRIPTION OF PROCEDURE:  The patient was identified in the holding room and transported to the operating room and placed in the supine position under the operating microscope.  The left eye was identified as the operative eye and it was prepped and draped in the usual sterile ophthalmic fashion.   A 1.0 millimeter clear-corneal paracentesis was made at the 5:00 position. 0.5 ml of preservative-free 1% lidocaine with epinephrine was injected into the anterior chamber.  The anterior chamber was filled with Healon 5 viscoelastic.  A 2.4 millimeter keratome was used to make a near-clear corneal incision at the 2:00 position.  A curvilinear capsulorrhexis was made with a cystotome and capsulorrhexis forceps.  Balanced salt solution was used to hydrodissect and hydrodelineate the nucleus.   Phacoemulsification was then used in stop and chop fashion to remove the lens nucleus and epinucleus.  The remaining cortex was then removed using the irrigation and aspiration handpiece. Healon was then placed into the capsular bag to distend it for lens placement.  A lens was then injected into the capsular bag.  The remaining viscoelastic was aspirated.   Wounds were hydrated  with balanced salt solution.  The anterior chamber was inflated to a physiologic pressure with balanced salt solution.  Intracameral vigamox 0.1 mL undiltued was injected into the eye and a drop placed onto the ocular surface.  No wound leaks were noted.  The patient was taken to the recovery room in stable condition without complications of anesthesia or surgery  Benay Pillow 09/18/2017, 12:01 PM

## 2017-09-18 NOTE — H&P (Signed)
The History and Physical notes are on paper, have been signed, and are to be scanned.   I have examined the patient and there are no changes to the H&P.   Benay Pillow 09/18/2017 10:59 AM

## 2017-09-18 NOTE — Anesthesia Procedure Notes (Signed)
Procedure Name: MAC Performed by: Raigen Jagielski, CRNA Pre-anesthesia Checklist: Patient identified, Emergency Drugs available, Suction available, Timeout performed and Patient being monitored Patient Re-evaluated:Patient Re-evaluated prior to induction Oxygen Delivery Method: Nasal cannula Placement Confirmation: positive ETCO2       

## 2017-09-18 NOTE — Anesthesia Preprocedure Evaluation (Signed)
Anesthesia Evaluation  Patient identified by MRN, date of birth, ID band Patient awake    Reviewed: Allergy & Precautions, NPO status , Patient's Chart, lab work & pertinent test results  History of Anesthesia Complications Negative for: history of anesthetic complications  Airway Mallampati: III  TM Distance: >3 FB Neck ROM: Full    Dental no notable dental hx.    Pulmonary former smoker (quit age 67),  Snoring    Pulmonary exam normal breath sounds clear to auscultation       Cardiovascular Exercise Tolerance: Good negative cardio ROS Normal cardiovascular exam Rhythm:Regular Rate:Normal     Neuro/Psych negative neurological ROS     GI/Hepatic GERD  ,  Endo/Other  negative endocrine ROS  Renal/GU negative Renal ROS     Musculoskeletal   Abdominal   Peds  Hematology negative hematology ROS (+)   Anesthesia Other Findings BPH  Reproductive/Obstetrics                             Anesthesia Physical Anesthesia Plan  ASA: II  Anesthesia Plan: MAC   Post-op Pain Management:    Induction: Intravenous  PONV Risk Score and Plan: 1 and TIVA and Midazolam  Airway Management Planned: Natural Airway  Additional Equipment:   Intra-op Plan:   Post-operative Plan:   Informed Consent: I have reviewed the patients History and Physical, chart, labs and discussed the procedure including the risks, benefits and alternatives for the proposed anesthesia with the patient or authorized representative who has indicated his/her understanding and acceptance.     Plan Discussed with: CRNA  Anesthesia Plan Comments:         Anesthesia Quick Evaluation

## 2017-09-18 NOTE — Anesthesia Postprocedure Evaluation (Signed)
Anesthesia Post Note  Patient: Craig [name]  Procedure(s) Performed: CATARACT EXTRACTION PHACO AND INTRAOCULAR LENS PLACEMENT (IOC) LEFT (Left Eye)  Patient location during evaluation: PACU Anesthesia Type: MAC Level of consciousness: awake and alert, oriented and patient cooperative Pain management: pain level controlled Vital Signs Assessment: post-procedure vital signs reviewed and stable Respiratory status: spontaneous breathing, nonlabored ventilation and respiratory function stable Cardiovascular status: blood pressure returned to baseline and stable Postop Assessment: adequate PO intake Anesthetic complications: no    Darrin Nipper

## 2017-09-18 NOTE — Transfer of Care (Signed)
Immediate Anesthesia Transfer of Care Note  Patient: Craig [name]  Procedure(s) Performed: CATARACT EXTRACTION PHACO AND INTRAOCULAR LENS PLACEMENT (IOC) LEFT (Left Eye)  Patient Location: PACU  Anesthesia Type: MAC  Level of Consciousness: awake, alert  and patient cooperative  Airway and Oxygen Therapy: Patient Spontanous Breathing and Patient connected to supplemental oxygen  Post-op Assessment: Post-op Vital signs reviewed, Patient's Cardiovascular Status Stable, Respiratory Function Stable, Patent Airway and No signs of Nausea or vomiting  Post-op Vital Signs: Reviewed and stable  Complications: No apparent anesthesia complications

## 2017-09-19 ENCOUNTER — Encounter: Payer: Self-pay | Admitting: Ophthalmology

## 2017-11-28 ENCOUNTER — Other Ambulatory Visit: Payer: Self-pay

## 2017-11-29 ENCOUNTER — Other Ambulatory Visit: Payer: Self-pay

## 2017-11-29 DIAGNOSIS — Z86010 Personal history of colonic polyps: Secondary | ICD-10-CM

## 2017-11-29 NOTE — Progress Notes (Signed)
Gastroenterology Pre-Procedure Review  Request Date: 6/13 Requesting Physician: Dr. Allen Norris  PATIENT REVIEW QUESTIONS: The patient responded to the following health history questions as indicated:    1. Are you having any GI issues? no 2. Do you have a personal history of Polyps? yes (5 year repeat) 3. Do you have a family history of Colon Cancer or Polyps? no 4. Diabetes Mellitus? no 5. Joint replacements in the past 12 months?no 6. Major health problems in the past 3 months?no 7. Any artificial heart valves, MVP, or defibrillator?no    MEDICATIONS & ALLERGIES:    Patient reports the following regarding taking any anticoagulation/antiplatelet therapy:   Plavix, Coumadin, Eliquis, Xarelto, Lovenox, Pradaxa, Brilinta, or Effient? no Aspirin? no  Patient confirms/reports the following medications:  Current Outpatient Medications  Medication Sig Dispense Refill  . ibuprofen (ADVIL,MOTRIN) 200 MG tablet Take 200 mg by mouth 2 (two) times daily.     Marland Kitchen omeprazole (PRILOSEC) 40 MG capsule Take 20 mg by mouth daily.     . sildenafil (REVATIO) 20 MG tablet Take 3 to 5 tablets two hours before intercouse on an empty stomach.  Do not take with nitrates. 50 tablet 3  . tadalafil (CIALIS) 5 MG tablet Take 1 tablet (5 mg total) daily as needed by mouth for erectile dysfunction. (Patient not taking: Reported on 08/13/2017) 90 tablet 3  . UNABLE TO FIND Take 1 capsule 2 (two) times daily by mouth. Med Name: Super Beta Prostate    . UNABLE TO FIND Take 1 capsule 2 (two) times daily by mouth. Med Name: Ageless Male     No current facility-administered medications for this visit.     Patient confirms/reports the following allergies:  Allergies  Allergen Reactions  . Ivp Dye [Iodinated Diagnostic Agents] Anaphylaxis    Orders Placed This Encounter  Procedures  . Procedural/ Surgical Case Request: COLONOSCOPY WITH PROPOFOL    Standing Status:   Standing    Number of Occurrences:   1    Order  Specific Question:   Pre-op diagnosis    Answer:   Hx of Polyps, z86.010    Order Specific Question:   CPT Code    Answer:   29528  . Ambulatory referral to Gastroenterology    Referral Priority:   Routine    Referral Type:   Consultation    Referral Reason:   Specialty Services Required    Referred to Provider:   Lucilla Lame, MD    Number of Visits Requested:   1    AUTHORIZATION INFORMATION Primary Insurance: 1D#: Group #:  Secondary Insurance: 1D#: Group #:  SCHEDULE INFORMATION: Date: 6/13 Time: Location: [place]

## 2017-12-03 ENCOUNTER — Telehealth: Payer: Self-pay

## 2017-12-03 NOTE — Telephone Encounter (Signed)
Patient contacted office stated that he has just received his instructions and did not know that he was not allowed to take Ibuprofen- he took 3 this am.  Discussed with Dr. Vicente Males and he advised me to tell patient that he may still have his colonoscopy and do not take anymore Ibuprofen.  Patient expressed understanding when I advised him not to take anymore.  Thanks [employer]

## 2017-12-05 ENCOUNTER — Other Ambulatory Visit: Payer: Self-pay

## 2017-12-05 NOTE — Discharge Instructions (Signed)
General Anesthesia, Adult, Care After °These instructions provide you with information about caring for yourself after your procedure. Your health care provider may also give you more specific instructions. Your treatment has been planned according to current medical practices, but problems sometimes occur. Call your health care provider if you have any problems or questions after your procedure. °What can I expect after the procedure? °After the procedure, it is common to have: °· Vomiting. °· A sore throat. °· Mental slowness. ° °It is common to feel: °· Nauseous. °· Cold or shivery. °· Sleepy. °· Tired. °· Sore or achy, even in parts of your body where you did not have surgery. ° °Follow these instructions at home: °For at least 24 hours after the procedure: °· Do not: °? Participate in activities where you could fall or become injured. °? Drive. °? Use heavy machinery. °? Drink alcohol. °? Take sleeping pills or medicines that cause drowsiness. °? Make important decisions or sign legal documents. °? Take care of children on your own. °· Rest. °Eating and drinking °· If you vomit, drink water, juice, or soup when you can drink without vomiting. °· Drink enough fluid to keep your urine clear or pale yellow. °· Make sure you have little or no nausea before eating solid foods. °· Follow the diet recommended by your health care provider. °General instructions °· Have a responsible adult stay with you until you are awake and alert. °· Return to your normal activities as told by your health care provider. Ask your health care provider what activities are safe for you. °· Take over-the-counter and prescription medicines only as told by your health care provider. °· If you smoke, do not smoke without supervision. °· Keep all follow-up visits as told by your health care provider. This is important. °Contact a health care provider if: °· You continue to have nausea or vomiting at home, and medicines are not helpful. °· You  cannot drink fluids or start eating again. °· You cannot urinate after 8-12 hours. °· You develop a skin rash. °· You have fever. °· You have increasing redness at the site of your procedure. °Get help right away if: °· You have difficulty breathing. °· You have chest pain. °· You have unexpected bleeding. °· You feel that you are having a life-threatening or urgent problem. °This information is not intended to replace advice given to you by your health care provider. Make sure you discuss any questions you have with your health care provider. °Document Released: 09/18/2000 Document Revised: 11/15/2015 Document Reviewed: 05/27/2015 °Elsevier Interactive Patient Education © 2018 [employer] ° °

## 2017-12-06 ENCOUNTER — Ambulatory Visit
Admission: RE | Admit: 2017-12-06 | Discharge: 2017-12-06 | Disposition: A | Discharge status: Home / Self Care | Payer: Medicare Other | Source: Ambulatory Visit | Attending: Gastroenterology | Admitting: Gastroenterology

## 2017-12-06 ENCOUNTER — Encounter
Admission: RE | Disposition: A | Discharge status: Home / Self Care | Payer: Self-pay | Source: Ambulatory Visit | Attending: Gastroenterology

## 2017-12-06 ENCOUNTER — Ambulatory Visit: Payer: Medicare Other | Admitting: Anesthesiology

## 2017-12-06 DIAGNOSIS — D124 Benign neoplasm of descending colon: Secondary | ICD-10-CM | POA: Diagnosis not present

## 2017-12-06 DIAGNOSIS — Z79899 Other long term (current) drug therapy: Secondary | ICD-10-CM | POA: Insufficient documentation

## 2017-12-06 DIAGNOSIS — K219 Gastro-esophageal reflux disease without esophagitis: Secondary | ICD-10-CM | POA: Diagnosis not present

## 2017-12-06 DIAGNOSIS — N529 Male erectile dysfunction, unspecified: Secondary | ICD-10-CM | POA: Insufficient documentation

## 2017-12-06 DIAGNOSIS — E559 Vitamin D deficiency, unspecified: Secondary | ICD-10-CM | POA: Diagnosis not present

## 2017-12-06 DIAGNOSIS — Z791 Long term (current) use of non-steroidal anti-inflammatories (NSAID): Secondary | ICD-10-CM | POA: Insufficient documentation

## 2017-12-06 DIAGNOSIS — Z87891 Personal history of nicotine dependence: Secondary | ICD-10-CM | POA: Diagnosis not present

## 2017-12-06 DIAGNOSIS — K5730 Diverticulosis of large intestine without perforation or abscess without bleeding: Secondary | ICD-10-CM | POA: Insufficient documentation

## 2017-12-06 DIAGNOSIS — Z1211 Encounter for screening for malignant neoplasm of colon: Secondary | ICD-10-CM | POA: Insufficient documentation

## 2017-12-06 DIAGNOSIS — K640 First degree hemorrhoids: Secondary | ICD-10-CM | POA: Diagnosis not present

## 2017-12-06 DIAGNOSIS — Z860100 Personal history of colon polyps, unspecified: Secondary | ICD-10-CM

## 2017-12-06 HISTORY — PX: POLYPECTOMY: SHX5525

## 2017-12-06 HISTORY — PX: COLONOSCOPY WITH PROPOFOL: SHX5780

## 2017-12-06 SURGERY — COLONOSCOPY WITH PROPOFOL
Anesthesia: General

## 2017-12-06 MED ORDER — ACETAMINOPHEN 160 MG/5ML PO SOLN: 325.0000 mg | ORAL | Status: DC | PRN

## 2017-12-06 MED ORDER — PROPOFOL 10 MG/ML IV BOLUS
INTRAVENOUS | Status: DC | PRN
  Administered 2017-12-06 (×2): 30 mg via INTRAVENOUS
  Administered 2017-12-06: 50 mg via INTRAVENOUS
  Administered 2017-12-06: 30 mg via INTRAVENOUS
  Administered 2017-12-06 (×2): 20 mg via INTRAVENOUS
  Administered 2017-12-06: 100 mg via INTRAVENOUS

## 2017-12-06 MED ORDER — SODIUM CHLORIDE 0.9 % IV SOLN: INTRAVENOUS | Status: DC

## 2017-12-06 MED ORDER — LACTATED RINGERS IV SOLN
10.0000 mL/h | INTRAVENOUS | Status: DC
  Administered 2017-12-06: 10 mL/h via INTRAVENOUS

## 2017-12-06 MED ORDER — STERILE WATER FOR IRRIGATION IR SOLN
Status: DC | PRN
  Administered 2017-12-06: 2 mL

## 2017-12-06 MED ORDER — LIDOCAINE HCL (CARDIAC) PF 100 MG/5ML IV SOSY
PREFILLED_SYRINGE | INTRAVENOUS | Status: DC | PRN
  Administered 2017-12-06: 40 mg via INTRAVENOUS

## 2017-12-06 MED ORDER — ONDANSETRON HCL 4 MG/2ML IJ SOLN: 4.0000 mg | Freq: Once | INTRAMUSCULAR | Status: DC | PRN

## 2017-12-06 MED ORDER — ACETAMINOPHEN 325 MG PO TABS: 325.0000 mg | ORAL_TABLET | ORAL | Status: DC | PRN

## 2017-12-06 SURGICAL SUPPLY — 24 items
CANISTER SUCT 1200ML W/VALVE (MISCELLANEOUS) ×3 IMPLANT
CLIP HMST 235XBRD CATH ROT (MISCELLANEOUS) IMPLANT
CLIP RESOLUTION 360 11X235 (MISCELLANEOUS)
ELECT REM PT RETURN 9FT ADLT (ELECTROSURGICAL)
ELECTRODE REM PT RTRN 9FT ADLT (ELECTROSURGICAL) IMPLANT
FCP ESCP3.2XJMB 240X2.8X (MISCELLANEOUS)
FORCEPS BIOP RAD 4 LRG CAP 4 (CUTTING FORCEPS) IMPLANT
FORCEPS BIOP RJ4 240 W/NDL (MISCELLANEOUS)
FORCEPS ESCP3.2XJMB 240X2.8X (MISCELLANEOUS) IMPLANT
GOWN CVR UNV OPN BCK APRN NK (MISCELLANEOUS) ×2 IMPLANT
GOWN ISOL THUMB LOOP REG UNIV (MISCELLANEOUS) ×4
INJECTOR VARIJECT VIN23 (MISCELLANEOUS) IMPLANT
KIT DEFENDO VALVE AND CONN (KITS) IMPLANT
KIT ENDO PROCEDURE OLY (KITS) ×3 IMPLANT
MARKER SPOT ENDO TATTOO 5ML (MISCELLANEOUS) IMPLANT
PROBE APC STR FIRE (PROBE) IMPLANT
RETRIEVER NET ROTH 2.5X230 LF (MISCELLANEOUS) IMPLANT
SNARE SHORT THROW 13M SML OVAL (MISCELLANEOUS) ×3 IMPLANT
SNARE SHORT THROW 30M LRG OVAL (MISCELLANEOUS) IMPLANT
SNARE SNG USE RND 15MM (INSTRUMENTS) IMPLANT
SPOT EX ENDOSCOPIC TATTOO (MISCELLANEOUS)
TRAP ETRAP POLY (MISCELLANEOUS) ×3 IMPLANT
VARIJECT INJECTOR VIN23 (MISCELLANEOUS)
WATER STERILE IRR 250ML POUR (IV SOLUTION) ×3 IMPLANT

## 2017-12-06 NOTE — Anesthesia Procedure Notes (Signed)
Procedure Name: MAC Date/Time: 12/06/2017 8:56 AM Performed by: Janna Arch, CRNA Pre-anesthesia Checklist: Patient identified, Emergency Drugs available, Suction available and Patient being monitored Patient Re-evaluated:Patient Re-evaluated prior to induction Oxygen Delivery Method: Nasal cannula

## 2017-12-06 NOTE — Anesthesia Postprocedure Evaluation (Signed)
Anesthesia Post Note  Patient: Craig [name]  Procedure(s) Performed: COLONOSCOPY WITH PROPOFOL (N/A ) POLYPECTOMY (N/A )  Patient location during evaluation: PACU Anesthesia Type: General Level of consciousness: awake Pain management: pain level controlled Vital Signs Assessment: post-procedure vital signs reviewed and stable Respiratory status: respiratory function stable Cardiovascular status: stable Postop Assessment: no signs of nausea or vomiting Anesthetic complications: no    Veda Canning

## 2017-12-06 NOTE — Anesthesia Preprocedure Evaluation (Signed)
Anesthesia Evaluation  Patient identified by MRN, date of birth, ID band Patient awake    Reviewed: Allergy & Precautions, NPO status , Patient's Chart, lab work & pertinent test results  Airway Mallampati: II  TM Distance: >3 FB     Dental   Pulmonary neg recent URI, former smoker,    breath sounds clear to auscultation       Cardiovascular Exercise Tolerance: Good (-) anginanegative cardio ROS   Rhythm:Regular Rate:Normal     Neuro/Psych    GI/Hepatic GERD  Medicated,  Endo/Other  Male hypogonadism BPH Vitamin D deficiency  Renal/GU      Musculoskeletal   Abdominal   Peds  Hematology   Anesthesia Other Findings   Reproductive/Obstetrics                             Anesthesia Physical Anesthesia Plan  ASA: II  Anesthesia Plan: General   Post-op Pain Management:    Induction: Intravenous  PONV Risk Score and Plan:   Airway Management Planned: Natural Airway  Additional Equipment:   Intra-op Plan:   Post-operative Plan:   Informed Consent: I have reviewed the patients History and Physical, chart, labs and discussed the procedure including the risks, benefits and alternatives for the proposed anesthesia with the patient or authorized representative who has indicated his/her understanding and acceptance.   Dental advisory given  Plan Discussed with: CRNA  Anesthesia Plan Comments:         Anesthesia Quick Evaluation

## 2017-12-06 NOTE — H&P (Signed)
Craig Lame, MD [address] [place], [place] [address] Phone:[phone] Fax : [phone]  Primary Care Physician:  Sofie Hartigan, MD Primary Gastroenterologist:  Dr. Allen Norris  Pre-Procedure History & Physical: HPI:  Craig [name] is a 67 y.o. male is here for an colonoscopy.   Past Medical History:  Diagnosis Date  . BPH with obstruction/lower urinary tract symptoms   . Erectile dysfunction   . GERD (gastroesophageal reflux disease)   . Hypogonadism in male   . 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 Vitamin D deficiency     Past Surgical History:  Procedure Laterality Date  . ANTERIOR RELEASE VERTEBRAL BODY W/ POSTERIOR FUSION    . APPENDECTOMY    . CATARACT EXTRACTION W/PHACO Right 08/21/2017   Procedure: CATARACT EXTRACTION PHACO AND INTRAOCULAR LENS PLACEMENT ([place]) RIGHT;  Surgeon: Eulogio Bear, MD;  Location: [facility];  Service: Ophthalmology;  Laterality: Right;  . CATARACT EXTRACTION W/PHACO Left 09/18/2017   Procedure: CATARACT EXTRACTION PHACO AND INTRAOCULAR LENS PLACEMENT ([facility]) LEFT;  Surgeon: Eulogio Bear, MD;  Location: [facility];  Service: Ophthalmology;  Laterality: Left;  . HERNIA REPAIR  1975/1960  . LEG SURGERY Right 2012  . NECK SURGERY    . ruptured disc    . VARICOCELECTOMY      Prior to Admission medications   Medication Sig Start Date End Date Taking? Authorizing Provider  omeprazole (PRILOSEC) 40 MG capsule Take 20 mg by mouth daily. am   Yes [provider]  ibuprofen (ADVIL,MOTRIN) 200 MG tablet Take 200 mg by mouth 2 (two) times daily. 3 in am 2 at night    [provider]  sildenafil (REVATIO) 20 MG tablet Take 3 to 5 tablets two hours before intercouse on an empty stomach.  Do not take with nitrates. 05/11/17   Zara Council A, PA-C  tadalafil (CIALIS) 5 MG tablet Take 1 tablet (5 mg total) daily as needed by mouth for erectile  dysfunction. Patient not taking: Reported on 08/13/2017 05/11/17   Zara Council A, PA-C  UNABLE TO FIND Take 1 capsule 2 (two) times daily by mouth. Med Name: Super Beta Prostate    [provider]  UNABLE TO FIND Take 1 capsule 2 (two) times daily by mouth. Med Name: [place] Male    [provider]    Allergies as of 11/29/2017 - Review Complete 09/18/2017  Allergen Reaction Noted  . Ivp dye [iodinated diagnostic agents] Anaphylaxis 05/11/2017    Family History  Problem Relation Age of Onset  . Kidney cancer Neg Hx   . Prostate cancer Neg Hx   . Kidney disease Neg Hx     Social History   Socioeconomic History  . Marital status: Unknown    Spouse name: Not on file  . Number of children: Not on file  . Years of education: Not on file  . Highest education level: Not on file  Occupational History  . Not on file  Social Needs  . Financial resource strain: Not on file  . Food insecurity:    Worry: Not on file    Inability: Not on file  . Transportation needs:    Medical: Not on file    Non-medical: Not on file  Tobacco Use  . Smoking status: Former Smoker    Types: Cigarettes  . Smokeless tobacco: Never Used  . Tobacco comment: quit 40 years  Substance and Sexual Activity  .  Alcohol use: Yes    Comment: occasional  . Drug use: No  . Sexual activity: Not on file  Lifestyle  . Physical activity:    Days per week: Not on file    Minutes per session: Not on file  . Stress: Not on file  Relationships  . Social connections:    Talks on phone: Not on file    Gets together: Not on file    Attends religious service: Not on file    Active member of club or organization: Not on file    Attends meetings of clubs or organizations: Not on file    Relationship status: Not on file  . Intimate partner violence:    Fear of current or ex partner: Not on file    Emotionally abused: Not on file    Physically abused: Not on file    Forced sexual activity: Not  on file  Other Topics Concern  . Not on file  Social History Narrative  . Not on file    Review of Systems: See HPI, otherwise negative ROS  Physical Exam: BP 113/86   Pulse 62   Temp (!) 97.2 F (36.2 C) (Temporal)   Resp 16   Ht 5\' 8"  (1.727 m)   Wt 190 lb (86.2 kg)   SpO2 100%   BMI 28.89 kg/m  General:   Alert,  pleasant and cooperative in NAD Head:  Normocephalic and atraumatic. Neck:  Supple; no masses or thyromegaly. Lungs:  Clear throughout to auscultation.    Heart:  Regular rate and rhythm. Abdomen:  Soft, nontender and nondistended. Normal bowel sounds, without guarding, and without rebound.   Neurologic:  Alert and  oriented x4;  grossly normal neurologically.  Impression/Plan: Craig [name] is here for an colonoscopy to be performed for history of colon polyps  Risks, benefits, limitations, and alternatives regarding  colonoscopy have been reviewed with the patient.  Questions have been answered.  All parties agreeable.   Craig Lame, MD  12/06/2017, 8:15 AM

## 2017-12-06 NOTE — Op Note (Signed)
[facility] Gastroenterology Patient Name: Craig [name] Procedure Date: 12/06/2017 8:46 AM MRN: [id number] Account #: [id number] Date of Birth: 09-Mar-1951 Admit Type: Outpatient Age: 66 Room: [facility] OR ROOM 01 Gender: Male Note Status: Finalized Procedure:            Colonoscopy Indications:          High risk colon cancer surveillance: Personal history                        of colonic polyps Providers:            Lucilla Lame MD, MD Referring MD:         Sofie Hartigan (Referring MD) Medicines:            Propofol per Anesthesia Complications:        No immediate complications. Procedure:            Pre-Anesthesia Assessment:                       - Prior to the procedure, a History and Physical was                        performed, and patient medications and allergies were                        reviewed. The patient's tolerance of previous                        anesthesia was also reviewed. The risks and benefits of                        the procedure and the sedation options and risks were                        discussed with the patient. All questions were                        answered, and informed consent was obtained. Prior                        Anticoagulants: The patient has taken no previous                        anticoagulant or antiplatelet agents. ASA Grade                        Assessment: II - A patient with mild systemic disease.                        After reviewing the risks and benefits, the patient was                        deemed in satisfactory condition to undergo the                        procedure.                       After obtaining informed consent, the colonoscope was  passed under direct vision. Throughout the procedure,                        the patient's blood pressure, pulse, and oxygen                        saturations were monitored continuously. The                        Colonoscope  was introduced through the anus and                        advanced to the the cecum, identified by appendiceal                        orifice and ileocecal valve. The colonoscopy was                        performed without difficulty. The patient tolerated the                        procedure well. The quality of the bowel preparation                        was excellent. Findings:      The perianal and digital rectal examinations were normal.      A 4 mm polyp was found in the descending colon. The polyp was sessile.       The polyp was removed with a cold snare. Resection and retrieval were       complete.      A few small-mouthed diverticula were found in the sigmoid colon.      Non-bleeding internal hemorrhoids were found during retroflexion. The       hemorrhoids were Grade I (internal hemorrhoids that do not prolapse). Impression:           - One 4 mm polyp in the descending colon, removed with                        a cold snare. Resected and retrieved.                       - Diverticulosis in the sigmoid colon.                       - Non-bleeding internal hemorrhoids. Recommendation:       - Discharge patient to home.                       - Resume previous diet.                       - Continue present medications.                       - Await pathology results.                       - Repeat colonoscopy in 5 years for surveillance. Procedure Code(s):    --- Professional ---  45385, Colonoscopy, flexible; with removal of tumor(s),                        polyp(s), or other lesion(s) by snare technique Diagnosis Code(s):    --- Professional ---                       Z86.010, Personal history of colonic polyps                       D12.4, Benign neoplasm of descending colon CPT copyright 2017 American [employer]. All rights reserved. The codes documented in this report are preliminary and upon coder review may  be revised to meet current  compliance requirements. Lucilla Lame MD, MD 12/06/2017 9:16:25 AM This report has been signed electronically. Number of Addenda: 0 Note Initiated On: 12/06/2017 8:46 AM Scope Withdrawal Time: 0 hours 8 minutes 23 seconds  Total Procedure Duration: 0 hours 10 minutes 54 seconds       [facility]

## 2017-12-06 NOTE — Transfer of Care (Signed)
Immediate Anesthesia Transfer of Care Note  Patient: Craig [name]  Procedure(s) Performed: COLONOSCOPY WITH PROPOFOL (N/A ) POLYPECTOMY (N/A )  Patient Location: PACU  Anesthesia Type: General  Level of Consciousness: awake, alert  and patient cooperative  Airway and Oxygen Therapy: Patient Spontanous Breathing and Patient connected to supplemental oxygen  Post-op Assessment: Post-op Vital signs reviewed, Patient's Cardiovascular Status Stable, Respiratory Function Stable, Patent Airway and No signs of Nausea or vomiting  Post-op Vital Signs: Reviewed and stable  Complications: No apparent anesthesia complications

## 2017-12-07 ENCOUNTER — Encounter: Payer: Self-pay | Admitting: Gastroenterology

## 2017-12-10 ENCOUNTER — Encounter: Payer: Self-pay | Admitting: Gastroenterology

## 2018-05-10 ENCOUNTER — Ambulatory Visit: Payer: Medicare Other | Admitting: Urology

## 2018-05-13 ENCOUNTER — Encounter: Payer: Self-pay | Admitting: Urology

## 2018-05-13 ENCOUNTER — Ambulatory Visit: Payer: Medicare Other | Admitting: Urology

## 2018-05-13 VITALS — BP 137/81 | HR 70 | Ht 68.0 in | Wt 204.1 lb

## 2018-05-13 DIAGNOSIS — N401 Enlarged prostate with lower urinary tract symptoms: Secondary | ICD-10-CM

## 2018-05-13 DIAGNOSIS — N529 Male erectile dysfunction, unspecified: Secondary | ICD-10-CM

## 2018-05-13 DIAGNOSIS — N138 Other obstructive and reflux uropathy: Secondary | ICD-10-CM | POA: Diagnosis not present

## 2018-05-13 LAB — BLADDER SCAN AMB NON-IMAGING: Scan Result: 87

## 2018-05-13 MED ORDER — SILDENAFIL CITRATE 20 MG PO TABS: ORAL_TABLET | ORAL | 3 refills | Status: DC

## 2018-05-13 MED ORDER — TAMSULOSIN HCL 0.4 MG PO CAPS
0.4000 mg | ORAL_CAPSULE | Freq: Every day | ORAL | 3 refills | Status: DC
Start: 2018-05-13 — End: 2019-05-19

## 2018-05-13 NOTE — Progress Notes (Signed)
05/13/2018 4:15 PM   Craig [name] March 08, 1951 [id number]  Referring provider: Sofie Hartigan, MD [place], [place] [address]  Chief Complaint  Patient presents with  . Follow-up    1 year  . Benign Prostatic Hypertrophy    HPI: Patient is a 67 year old Caucasian male with ED and BPH with LUTS who presents today for a 12 months follow up.  Erectile dysfunction His SHIM score is 23, which is mild ED.   His previous SHIM score is 20.  He has been having difficulty with erections for the last few years.   His major complaint is maintaining.  His libido is preserved.   His risk factors for ED are age and  BPH.  He denies any painful erections or curvatures with his erections.   He has tried PDE5-inhibitors in the past with good results.   SHIM    Row Name 05/13/18 1542         SHIM: Over the last 6 months:   How do you rate your confidence that you could get and keep an erection?  High     When you had erections with sexual stimulation, how often were your erections hard enough for penetration (entering your partner)?  Most Times (much more than half the time)     During sexual intercourse, how often were you able to maintain your erection after you had penetrated (entered) your partner?  Almost Always or Always     During sexual intercourse, how difficult was it to maintain your erection to completion of intercourse?  Not Difficult     When you attempted sexual intercourse, how often was it satisfactory for you?  Almost Always or Always       SHIM Total Score   SHIM  23        Score: 1-7 Severe ED 8-11 Moderate ED 12-16 Mild-Moderate ED 17-21 Mild ED 22-25 No ED  BPH WITH LUTS His IPSS score today is 21, which is severe lower urinary tract symptomatology.   He is mostly satisfied with his quality life due to his urinary symptoms.  His PVR is 82 mL.  His previous IPSS score was 25/3.  His major complaints today are frequency, urgency, nocturia,  incontinence (post void), intermittency and a weak stream.  He has had these symptoms for last  year.  He denies any dysuria, hematuria or suprapubic pain.  He is taking super beta prostate.  He also denies any recent fevers, chills, nausea or vomiting.  He does not have a family history of PCa. IPSS    Row Name 05/13/18 1500         International Prostate Symptom Score   How often have you had the sensation of not emptying your bladder?  About half the time     How often have you had to urinate less than every two hours?  About half the time     How often have you found you stopped and started again several times when you urinated?  More than half the time     How often have you found it difficult to postpone urination?  About half the time     How often have you had a weak urinary stream?  Almost always     How often have you had to strain to start urination?  Not at All     How many times did you typically get up at night to urinate?  3  Times     Total IPSS Score  21       Quality of Life due to urinary symptoms   If you were to spend the rest of your life with your urinary condition just the way it is now how would you feel about that?  Mostly Satisfied        Score:  1-7 Mild 8-19 Moderate 20-35 Severe  PMH: Past Medical History:  Diagnosis Date  . BPH with obstruction/lower urinary tract symptoms   . Erectile dysfunction   . GERD (gastroesophageal reflux disease)   . Hypogonadism in male   . 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 Vitamin D deficiency     Surgical History: Past Surgical History:  Procedure Laterality Date  . ANTERIOR RELEASE VERTEBRAL BODY W/ POSTERIOR FUSION    . APPENDECTOMY    . CATARACT EXTRACTION W/PHACO Right 08/21/2017   Procedure: CATARACT EXTRACTION PHACO AND INTRAOCULAR LENS PLACEMENT ([place]) RIGHT;  Surgeon: Eulogio Bear, MD;  Location: [place];  Service: Ophthalmology;  Laterality: Right;    . CATARACT EXTRACTION W/PHACO Left 09/18/2017   Procedure: CATARACT EXTRACTION PHACO AND INTRAOCULAR LENS PLACEMENT ([facility]) LEFT;  Surgeon: Eulogio Bear, MD;  Location: [place];  Service: Ophthalmology;  Laterality: Left;  . COLONOSCOPY WITH PROPOFOL N/A 12/06/2017   Procedure: COLONOSCOPY WITH PROPOFOL;  Surgeon: Lucilla Lame, MD;  Location: [place] [facility];  Service: Endoscopy;  Laterality: N/A;  . HERNIA REPAIR  1975/1960  . LEG SURGERY Right 2012  . NECK SURGERY    . POLYPECTOMY N/A 12/06/2017   Procedure: POLYPECTOMY;  Surgeon: Lucilla Lame, MD;  Location: [place];  Service: Endoscopy;  Laterality: N/A;  . ruptured disc    . VARICOCELECTOMY      Home Medications:  Allergies as of 05/13/2018      Reactions   Ivp Dye [iodinated Diagnostic Agents] Anaphylaxis      Medication List        Accurate as of 05/13/18  4:15 PM. Always use your most recent med list.          ibuprofen 200 MG tablet Commonly known as:  ADVIL,MOTRIN Take 200 mg by mouth 2 (two) times daily. 3 in am 2 at night   omeprazole 40 MG capsule Commonly known as:  PRILOSEC Take 20 mg by mouth daily. am   sildenafil 20 MG tablet Commonly known as:  REVATIO Take 3 to 5 tablets two hours before intercouse on an empty stomach.  Do not take with nitrates.   UNABLE TO FIND Take 1 capsule 2 (two) times daily by mouth. Med Name: Super Beta Prostate       Allergies:  Allergies  Allergen Reactions  . Ivp Dye [Iodinated Diagnostic Agents] Anaphylaxis    Family History: Family History  Problem Relation Age of Onset  . Kidney cancer Neg Hx   . Prostate cancer Neg Hx   . Kidney disease Neg Hx     Social History:  reports that he has quit smoking. His smoking use included cigarettes. He has never used smokeless tobacco. He reports that he drinks alcohol. He reports that he does not use drugs.  ROS: UROLOGY Frequent Urination?: Yes Hard to postpone urination?:  Yes Burning/pain with urination?: No Get up at night to urinate?: Yes Leakage of urine?: Yes Urine stream starts and stops?: Yes Trouble starting stream?: No Do you have to strain to urinate?: No Blood in  urine?: No Urinary tract infection?: No Sexually transmitted disease?: No Injury to kidneys or bladder?: No Painful intercourse?: No Weak stream?: Yes Erection problems?: No Penile pain?: No  Gastrointestinal Nausea?: No Vomiting?: No Indigestion/heartburn?: No Diarrhea?: No Constipation?: No  Constitutional Fever: No Night sweats?: No Weight loss?: No Fatigue?: No  Skin Skin rash/lesions?: No Itching?: No  Eyes Blurred vision?: No Double vision?: No  Ears/Nose/Throat Sore throat?: No Sinus problems?: No  Hematologic/Lymphatic Swollen glands?: No Easy bruising?: No  Cardiovascular Leg swelling?: No Chest pain?: Yes  Respiratory Cough?: No Shortness of breath?: No  Endocrine Excessive thirst?: No  Musculoskeletal Back pain?: No Joint pain?: Yes  Neurological Headaches?: No Dizziness?: No  Psychologic Depression?: No Anxiety?: No  Physical Exam: BP 137/81 (BP Location: Right Arm, Patient Position: Sitting, Cuff Size: Large)   Pulse 70   Ht 5\' 8"  (1.727 m)   Wt 204 lb 1.6 oz (92.6 kg)   BMI 31.03 kg/m   Constitutional: Well nourished. Alert and oriented, No acute distress. HEENT: [place] AT, moist mucus membranes. Trachea midline, no masses. Cardiovascular: No clubbing, cyanosis, or edema. Respiratory: Normal respiratory effort, no increased work of breathing. GI: Abdomen is soft, non tender, non distended, no abdominal masses. Liver and spleen not palpable.  No hernias appreciated.  Stool sample for occult testing is not indicated.   GU: No CVA tenderness.  No bladder fullness or masses.  Patient with uncircumcised phallus.  Foreskin easily retracted Urethral meatus is patent.  No penile discharge. No penile lesions or rashes. Scrotum  without lesions, cysts, rashes and/or edema.  Testicles are located scrotally bilaterally. No masses are appreciated in the testicles. Left and right epididymis are normal. Rectal: Patient with  normal sphincter tone. Anus and perineum without scarring or rashes. No rectal masses are appreciated. Prostate is approximately 55 grams, no nodules are appreciated. Seminal vesicles are normal. Skin: No rashes, bruises or suspicious lesions. Lymph: No cervical or inguinal adenopathy. Neurologic: Grossly intact, no focal deficits, moving all 4 extremities. Psychiatric: Normal mood and affect.  Laboratory Data: PSA History:  2.7 ng/mL on 11/04/2013  3.2 ng/mL on 04/06/2014  3.2 ng/mL on 11/05/2014  3.5 ng/mL on 05/01/2016  3.83 ng/mL on 05/11/2017  I have reviewed the labs.  Pertinent imaging Results for Craig [name], Craig [name] (MRN [id number]) as of 05/13/2018 16:21  Ref. Range 05/13/2018 16:16  Scan Result Unknown 87     Assessment & Plan:    1. Erectile dysfunction SHIM score is 23, it is mildly improved I explained to the patient that in order to achieve an erection it takes good functioning of the nervous system (parasympathetic, sympathetic, sensory and motor), good blood flow into the erectile tissue of the penis and a desire to have sex I explained that conditions like diabetes, hypertension, coronary artery disease, peripheral vascular disease, smoking, alcohol consumption, age, sleep apnea and BPH can diminish the ability to have an erection refilled Sildenafil 20 mg, 3 to 5 tablets two hours prior to intercourse on an empty stomach, # 33; he is warned not to take medications that contain nitrates RTC in 12 months for repeat SHIM score and exam   2. BPH with LUTS IPSS score is 21/2, it is slightly improved Continue conservative management, avoiding bladder irritants and timed voiding's Continue super beta prostate Restart tamsulosin to see if he has any improvement in his urinary  symptoms He is not interested in a bladder outlet procedure at this time RTC in 12 months for IPSS, PSA  and exam  Return in about 1 year (around 05/14/2019) for IPSS, SHIM, PSA and exam.  Zara Council, [facility]  [address] [place], [place] [address] [phone]

## 2018-05-14 ENCOUNTER — Telehealth: Payer: Self-pay

## 2018-05-14 LAB — PSA: Prostate Specific Ag, Serum: 3.7 ng/mL (ref 0.0–4.0)

## 2018-05-14 NOTE — Telephone Encounter (Signed)
-----   Message from Nori Riis, PA-C sent at 05/14/2018  7:31 AM EST ----- Please let Mr. Stetson know that his PSA is normal at 3.7.  We will see him next year.

## 2018-05-14 NOTE — Telephone Encounter (Signed)
Attempted to contact patient but numbers are not working.

## 2018-05-15 ENCOUNTER — Telehealth: Payer: Self-pay | Admitting: *Deleted

## 2018-05-15 NOTE — Telephone Encounter (Signed)
Notified patient as instructed, patient pleased. Discussed follow-up appointments with patient, patient agrees

## 2018-05-15 NOTE — Telephone Encounter (Signed)
-----   Message from Nori Riis, PA-C sent at 05/14/2018  7:31 AM EST ----- Please let Craig [name] know that his PSA is normal at 3.7.  We will see him next year.

## 2019-05-16 ENCOUNTER — Other Ambulatory Visit: Payer: Self-pay

## 2019-05-16 ENCOUNTER — Other Ambulatory Visit
Admission: RE | Admit: 2019-05-16 | Discharge: 2019-05-16 | Disposition: A | Discharge status: Home / Self Care | Payer: Medicare Other | Attending: Urology | Admitting: Urology

## 2019-05-16 DIAGNOSIS — N401 Enlarged prostate with lower urinary tract symptoms: Secondary | ICD-10-CM | POA: Diagnosis present

## 2019-05-16 DIAGNOSIS — N138 Other obstructive and reflux uropathy: Secondary | ICD-10-CM | POA: Insufficient documentation

## 2019-05-16 LAB — PSA: Prostatic Specific Antigen: 3.65 ng/mL (ref 0.00–4.00)

## 2019-05-18 NOTE — Progress Notes (Signed)
05/19/2019 9:51 AM   Craig [name] [birth year] [id number]  Referring provider: Sofie Hartigan, MD [address] [place],  [place] [address]  Chief Complaint  Patient presents with  . Benign Prostatic Hypertrophy    HPI: Patient is a 68 year old male with ED and BPH with LUTS who presents today for a 12 months follow up.  Erectile dysfunction His SHIM score is 22, which is no ED.   His previous SHIM score is 23.  He has been having difficulty with erections for the last few years.   His major complaint is maintaining.  His libido is preserved.   His risk factors for ED are age and  BPH.  He denies any painful erections or curvatures with his erections.   He has tried PDE5-inhibitors in the past with good results.   [place] Name 05/19/19 0915         SHIM: Over the last 6 months:   How do you rate your confidence that you could get and keep an erection?  Moderate     When you had erections with sexual stimulation, how often were your erections hard enough for penetration (entering your partner)?  Most Times (much more than half the time)     During sexual intercourse, how often were you able to maintain your erection after you had penetrated (entered) your partner?  Almost Always or Always     During sexual intercourse, how difficult was it to maintain your erection to completion of intercourse?  Not Difficult     When you attempted sexual intercourse, how often was it satisfactory for you?  Almost Always or Always       SHIM Total Score   SHIM  22        Score: 1-7 Severe ED 8-11 Moderate ED 12-16 Mild-Moderate ED 17-21 Mild ED 22-25 No ED  BPH WITH LUTS His IPSS score today is 20, which is severe lower urinary tract symptomatology.   He is mostly satisfied with his quality life due to his urinary symptoms.  His previous IPSS score was 21/2.  His previous PVR was 82 mL.  His major complaints today are urgency, incontinence and intermittency.  He has had these  symptoms for last year.  He denies any dysuria, hematuria or suprapubic pain.  He is taking super beta prostate.  He is taking the tamsulosin periodically.  He also denies any recent fevers, chills, nausea or vomiting.  He does not have a family history of PCa. IPSS    Row Name 05/19/19 0900         International Prostate Symptom Score   How often have you had the sensation of not emptying your bladder?  More than half the time     How often have you had to urinate less than every two hours?  Less than half the time     How often have you found you stopped and started again several times when you urinated?  Almost always     How often have you found it difficult to postpone urination?  About half the time     How often have you had a weak urinary stream?  More than half the time     How often have you had to strain to start urination?  Not at All     How many times did you typically get up at night to urinate?  2 Times     Total  IPSS Score  20       Quality of Life due to urinary symptoms   If you were to spend the rest of your life with your urinary condition just the way it is now how would you feel about that?  Mostly Satisfied        Score:  1-7 Mild 8-19 Moderate 20-35 Severe  PMH: Past Medical History:  Diagnosis Date  . BPH with obstruction/lower urinary tract symptoms   . Erectile dysfunction   . GERD (gastroesophageal reflux disease)   . Hypogonadism in male   . 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 Vitamin D deficiency     Surgical History: Past Surgical History:  Procedure Laterality Date  . ANTERIOR RELEASE VERTEBRAL BODY W/ POSTERIOR FUSION    . APPENDECTOMY    . CATARACT EXTRACTION W/PHACO Right 08/21/2017   Procedure: CATARACT EXTRACTION PHACO AND INTRAOCULAR LENS PLACEMENT ([place]) RIGHT;  Surgeon: Eulogio Bear, MD;  Location: [place];  Service: Ophthalmology;  Laterality: Right;  . CATARACT EXTRACTION  W/PHACO Left 09/18/2017   Procedure: CATARACT EXTRACTION PHACO AND INTRAOCULAR LENS PLACEMENT ([facility]) LEFT;  Surgeon: Eulogio Bear, MD;  Location: [place];  Service: Ophthalmology;  Laterality: Left;  . COLONOSCOPY WITH PROPOFOL N/A 12/06/2017   Procedure: COLONOSCOPY WITH PROPOFOL;  Surgeon: Lucilla Lame, MD;  Location: [place];  Service: Endoscopy;  Laterality: N/A;  . HERNIA REPAIR  1975/1960  . LEG SURGERY Right 2012  . NECK SURGERY    . POLYPECTOMY N/A 12/06/2017   Procedure: POLYPECTOMY;  Surgeon: Lucilla Lame, MD;  Location: [place];  Service: Endoscopy;  Laterality: N/A;  . ruptured disc    . VARICOCELECTOMY      Home Medications:  Allergies as of 05/19/2019      Reactions   Ivp Dye [iodinated Diagnostic Agents] Anaphylaxis      Medication List       Accurate as of May 19, 2019  9:51 AM. If you have any questions, ask your nurse or doctor.        ibuprofen 200 MG tablet Commonly known as: ADVIL Take 200 mg by mouth 2 (two) times daily. 3 in am 2 at night   omeprazole 40 MG capsule Commonly known as: PRILOSEC Take 20 mg by mouth daily. am   sildenafil 20 MG tablet Commonly known as: REVATIO Take 3 to 5 tablets two hours before intercouse on an empty stomach.  Do not take with nitrates.   tamsulosin 0.4 MG Caps capsule Commonly known as: FLOMAX Take 1 capsule (0.4 mg total) by mouth daily.   UNABLE TO FIND Take 1 capsule 2 (two) times daily by mouth. Med Name: Super Beta Prostate       Allergies:  Allergies  Allergen Reactions  . Ivp Dye [Iodinated Diagnostic Agents] Anaphylaxis    Family History: Family History  Problem Relation Age of Onset  . Kidney cancer Neg Hx   . Prostate cancer Neg Hx   . Kidney disease Neg Hx     Social History:  reports that he has quit smoking. His smoking use included cigarettes. He has never used smokeless tobacco. He reports current alcohol use. He reports that he does not use  drugs.  ROS: UROLOGY Frequent Urination?: No Hard to postpone urination?: Yes Burning/pain with urination?: No Get up at night to urinate?: No Leakage of urine?: Yes Urine stream starts and stops?: Yes Trouble starting stream?:  No Do you have to strain to urinate?: No Blood in urine?: No Urinary tract infection?: No Sexually transmitted disease?: No Injury to kidneys or bladder?: No Painful intercourse?: No Weak stream?: No Erection problems?: No Penile pain?: No  Gastrointestinal Nausea?: No Vomiting?: No Indigestion/heartburn?: No Diarrhea?: No Constipation?: No  Constitutional Fever: No Night sweats?: No Weight loss?: No Fatigue?: No  Skin Skin rash/lesions?: No Itching?: No  Eyes Blurred vision?: No Double vision?: No  Ears/Nose/Throat Sore throat?: No Sinus problems?: No  Hematologic/Lymphatic Swollen glands?: No Easy bruising?: No  Cardiovascular Leg swelling?: No Chest pain?: No  Respiratory Cough?: No Shortness of breath?: No  Endocrine Excessive thirst?: No  Musculoskeletal Back pain?: No Joint pain?: No  Neurological Headaches?: No Dizziness?: No  Psychologic Depression?: No Anxiety?: No  Physical Exam: BP (!) 142/79   Pulse (!) 55   Ht 5\' 8"  (1.727 m)   Wt 195 lb (88.5 kg)   BMI 29.65 kg/m   Constitutional:  Well nourished. Alert and oriented, No acute distress. HEENT: [place] AT, moist mucus membranes.  Trachea midline, no masses. Cardiovascular: No clubbing, cyanosis, or edema. Respiratory: Normal respiratory effort, no increased work of breathing. GI: Abdomen is soft, non tender, non distended, no abdominal masses. Liver and spleen not palpable.  No hernias appreciated.  Stool sample for occult testing is not indicated.   GU: No CVA tenderness.  No bladder fullness or masses.  Patient with circumcised phallus.   Urethral meatus is patent.  No penile discharge. No penile lesions or rashes. Scrotum without lesions, cysts,  rashes and/or edema.  Testicles are located scrotally bilaterally. No masses are appreciated in the testicles. Left and right epididymis are normal. Rectal: Patient with  normal sphincter tone. Anus and perineum without scarring or rashes. No rectal masses are appreciated. Prostate is approximately 50 grams, no nodules are appreciated. Seminal vesicles could not be palpated.  Skin: No rashes, bruises or suspicious lesions. Lymph: No inguinal adenopathy. Neurologic: Grossly intact, no focal deficits, moving all 4 extremities. Psychiatric: Normal mood and affect.  Laboratory Data: PSA History:  2.7 ng/mL on 11/04/2013  3.2 ng/mL on 04/06/2014  3.2 ng/mL on 11/05/2014  3.5 ng/mL on 05/01/2016  3.83 ng/mL on 05/11/2017  3.70 ng/mL on 05/13/2018   3.64 ng/mL on 05/16/2019 I have reviewed the labs.  Assessment & Plan:    1. Erectile dysfunction SHIM score is 22, it is mildly worsened RTC in 12 months for repeat SHIM score and exam   2. BPH with LUTS IPSS score is 20/2, it is slightly improved Continue conservative management, avoiding bladder irritants and timed voiding's Continue super beta prostate Takes tamsulosin 0.4 mg prn We discussed a bladder outlet procedures, but he is not interested in this at this time  RTC in 12 months for IPSS, PSA and exam  Return in about 1 year (around 05/18/2020) for IPSS, SHIM, PSA and exam.  Zara Council, [facility]  [place] [address] [place] [address] [place], [place] [address] [phone]

## 2019-05-19 ENCOUNTER — Encounter: Payer: Self-pay | Admitting: Urology

## 2019-05-19 ENCOUNTER — Ambulatory Visit: Payer: Medicare Other | Admitting: Urology

## 2019-05-19 ENCOUNTER — Other Ambulatory Visit: Payer: Self-pay

## 2019-05-19 VITALS — BP 142/79 | HR 55 | Ht 68.0 in | Wt 195.0 lb

## 2019-05-19 DIAGNOSIS — N138 Other obstructive and reflux uropathy: Secondary | ICD-10-CM

## 2019-05-19 DIAGNOSIS — N529 Male erectile dysfunction, unspecified: Secondary | ICD-10-CM

## 2019-05-19 DIAGNOSIS — N401 Enlarged prostate with lower urinary tract symptoms: Secondary | ICD-10-CM | POA: Diagnosis not present

## 2019-05-19 MED ORDER — TAMSULOSIN HCL 0.4 MG PO CAPS: 0.4000 mg | ORAL_CAPSULE | Freq: Every day | ORAL | 3 refills | Status: DC

## 2019-05-19 MED ORDER — SILDENAFIL CITRATE 20 MG PO TABS: ORAL_TABLET | ORAL | 3 refills | Status: DC

## 2019-07-09 ENCOUNTER — Ambulatory Visit: Payer: Medicare PPO | Attending: Internal Medicine

## 2019-07-09 DIAGNOSIS — Z20822 Contact with and (suspected) exposure to covid-19: Secondary | ICD-10-CM

## 2019-07-10 LAB — NOVEL CORONAVIRUS, NAA: SARS-CoV-2, NAA: NOT DETECTED

## 2019-10-28 ENCOUNTER — Ambulatory Visit (INDEPENDENT_AMBULATORY_CARE_PROVIDER_SITE_OTHER): Payer: Medicare PPO | Admitting: Urology

## 2019-10-28 ENCOUNTER — Encounter: Payer: Self-pay | Admitting: Urology

## 2019-10-28 ENCOUNTER — Other Ambulatory Visit: Payer: Self-pay

## 2019-10-28 VITALS — BP 144/78 | HR 51 | Ht 68.0 in | Wt 195.0 lb

## 2019-10-28 DIAGNOSIS — N5089 Other specified disorders of the male genital organs: Secondary | ICD-10-CM | POA: Diagnosis not present

## 2019-10-28 NOTE — Progress Notes (Signed)
10/28/2019 12:00 PM   Craig [name] [birth year] [id number]  Referring provider: Sofie Hartigan, MD [place],  [place] [address]  Chief Complaint  Patient presents with  . Testicle Pain    HPI: Mr. Craig [name] is a 69 year old male with erectile dysfunction and BPH with LUTS who presents today for a growth on his testicle.  He states he found it incidentally a few days ago.  It is not changed in consistency or size since this discovery.  He states it is tender on palpation.  He denied any injury to his scrotal region.  He is not having discomfort with ejaculation or hematospermia.  He is not experiencing dysuria or penile discharge.  His BPH and ED are stable.  He is not in need of refills on his sildenafil 20 mg or tamsulosin 0.4 mg.  Patient denies any modifying or aggravating factors.  Patient denies any gross hematuria, dysuria or suprapubic/flank pain.  Patient denies any fevers, chills, nausea or vomiting.   His UA today is unremarkable.  PMH: Past Medical History:  Diagnosis Date  . BPH with obstruction/lower urinary tract symptoms   . Erectile dysfunction   . GERD (gastroesophageal reflux disease)   . Hypogonadism in male   . 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 Vitamin D deficiency     Surgical History: Past Surgical History:  Procedure Laterality Date  . ANTERIOR RELEASE VERTEBRAL BODY W/ POSTERIOR FUSION    . APPENDECTOMY    . CATARACT EXTRACTION W/PHACO Right 08/21/2017   Procedure: CATARACT EXTRACTION PHACO AND INTRAOCULAR LENS PLACEMENT ([place]) RIGHT;  Surgeon: Eulogio Bear, MD;  Location: [facility];  Service: Ophthalmology;  Laterality: Right;  . CATARACT EXTRACTION W/PHACO Left 09/18/2017   Procedure: CATARACT EXTRACTION PHACO AND INTRAOCULAR LENS PLACEMENT ([facility]) LEFT;  Surgeon: Eulogio Bear, MD;  Location: [facility];  Service: Ophthalmology;  Laterality: Left;  . COLONOSCOPY  WITH PROPOFOL N/A 12/06/2017   Procedure: COLONOSCOPY WITH PROPOFOL;  Surgeon: Lucilla Lame, MD;  Location: [facility];  Service: Endoscopy;  Laterality: N/A;  . HERNIA REPAIR  1975/1960  . LEG SURGERY Right 2012  . NECK SURGERY    . POLYPECTOMY N/A 12/06/2017   Procedure: POLYPECTOMY;  Surgeon: Lucilla Lame, MD;  Location: [facility];  Service: Endoscopy;  Laterality: N/A;  . ruptured disc    . VARICOCELECTOMY      Home Medications:  Allergies as of 10/28/2019      Reactions   Ivp Dye [iodinated Diagnostic Agents] Anaphylaxis      Medication List       Accurate as of Oct 28, 2019 11:59 PM. If you have any questions, ask your nurse or doctor.        ibuprofen 200 MG tablet Commonly known as: ADVIL Take 200 mg by mouth 2 (two) times daily. 3 in am 2 at night   omeprazole 40 MG capsule Commonly known as: PRILOSEC Take 20 mg by mouth daily. am   sildenafil 20 MG tablet Commonly known as: REVATIO Take 3 to 5 tablets two hours before intercouse on an empty stomach.  Do not take with nitrates.   tamsulosin 0.4 MG Caps capsule Commonly known as: FLOMAX Take 1 capsule (0.4 mg total) by mouth daily.   UNABLE TO FIND Take 1 capsule 2 (two) times daily by mouth. Med Name: Super Beta Prostate       Allergies:  Allergies  Allergen Reactions  . Ivp Dye [Iodinated Diagnostic Agents] Anaphylaxis    Family History: Family History  Problem Relation Age of Onset  . Kidney cancer Neg Hx   . Prostate cancer Neg Hx   . Kidney disease Neg Hx     Social History:  reports that he has quit smoking. His smoking use included cigarettes. He has never used smokeless tobacco. He reports current alcohol use. He reports that he does not use drugs.  ROS: Pertinent ROS in HPI  Physical Exam: BP (!) 144/78   Pulse (!) 51   Ht 5\' 8"  (1.727 m)   Wt 195 lb (88.5 kg)   BMI 29.65 kg/m   Constitutional:  Well nourished. Alert and oriented, No acute distress. HEENT: [place] AT,  mask in place.  Trachea midline. Cardiovascular: No clubbing, cyanosis, or edema. Respiratory: Normal respiratory effort, no increased work of breathing. GI: Abdomen is soft, non tender, non distended, no abdominal masses. No hernias appreciated.  Stool sample for occult testing is not indicated.  GU: No CVA tenderness.  No bladder fullness or masses.  Patient with circumcised phallus.  Urethral meatus is patent.  No penile discharge. No penile lesions or rashes. Scrotum without lesions, cysts, rashes and/or edema.  Testicles are located scrotally bilaterally. No masses are appreciated in the testicles. Left and right epididymis are normal. Rectal:  Left epididymal cyst noted. Patient with  normal sphincter tone.  Lymph: No inguinal adenopathy. Neurologic: Grossly intact, no focal deficits, moving all 4 extremities. Psychiatric: Normal mood and affect.  Laboratory Data: Lab Results  Component Value Date   TESTOSTERONE 374 05/11/2017   Urinalysis Component     Latest Ref Rng & Units 10/28/2019  Specific Gravity, UA     1.005 - 1.030 >1.030 (H)  pH, UA     5.0 - 7.5 5.5  Color, UA     Yellow Yellow  Appearance Ur     Clear Cloudy (A)  Leukocytes,UA     Negative Negative  Protein,UA     Negative/Trace Negative  Glucose, UA     Negative Negative  Ketones, UA     Negative Negative  RBC, UA     Negative 1+ (A)  Bilirubin, UA     Negative Negative  Urobilinogen, Ur     0.2 - 1.0 mg/dL 0.2  Nitrite, UA     Negative Negative  Microscopic Examination      See below:   Component     Latest Ref Rng & Units 10/28/2019  WBC, UA     0 - 5 /hpf None seen  RBC     0 - 2 /hpf 0-2  Epithelial Cells (non renal)     0 - 10 /hpf None seen  Crystals     N/A Present (A)  Crystal Type     N/A Amorphous Sediment  Bacteria, UA     None seen/Few Few   I have reviewed the labs.   Pertinent Imaging: @CT @ @ultrasound @ @KUB @ I have independently reviewed the films.    Assessment &  Plan:    1. Testicle lump - Urinalysis, Complete - US SCROTUM W/DOPPLER; Future Likely a left epididymal cysts, will pursue scrotal ultrasound for confirmation I will call the patient with results  2. ED Follow-up in November for 1 year follow-up  3.  BPH with LU TS Follow-up in November for 1 year follow-up   Return in about 7 months (around 05/24/2020) for IPSS, SHIM, PSA and exam.  These notes generated with voice recognition software. I apologize for typographical errors.  Zara Council, PA-C  [facility] [address]  [place], [place] [address] [phone]

## 2019-10-30 LAB — URINALYSIS, COMPLETE
Bilirubin, UA: NEGATIVE
Glucose, UA: NEGATIVE
Ketones, UA: NEGATIVE
Leukocytes,UA: NEGATIVE
Nitrite, UA: NEGATIVE
Protein,UA: NEGATIVE
Specific Gravity, UA: >1.03 — ABNORMAL HIGH (ref 1.005–1.030)
Urobilinogen, Ur: 0.2 mg/dL (ref 0.2–1.0)
pH, UA: 5.5 (ref 5.0–7.5)

## 2019-10-30 LAB — MICROSCOPIC EXAMINATION
Epithelial Cells (non renal): NONE SEEN /hpf (ref 0–10)
WBC, UA: NONE SEEN /hpf (ref 0–5)

## 2019-11-23 ENCOUNTER — Telehealth: Payer: Self-pay | Admitting: Urology

## 2019-11-23 NOTE — Telephone Encounter (Signed)
Would you check on the status on Craig [name]'s scrotal ultrasound?

## 2019-11-25 NOTE — Telephone Encounter (Signed)
Scheduling has been calling him to schedule it but he has not called them back   Craig [name]

## 2019-12-03 ENCOUNTER — Ambulatory Visit
Admission: RE | Admit: 2019-12-03 | Discharge: 2019-12-03 | Disposition: A | Discharge status: Home / Self Care | Payer: Medicare PPO | Source: Ambulatory Visit | Attending: Urology | Admitting: Urology

## 2019-12-03 ENCOUNTER — Other Ambulatory Visit: Payer: Self-pay

## 2019-12-03 DIAGNOSIS — N5089 Other specified disorders of the male genital organs: Secondary | ICD-10-CM | POA: Diagnosis not present

## 2019-12-04 ENCOUNTER — Telehealth: Payer: Self-pay | Admitting: Family Medicine

## 2019-12-04 NOTE — Telephone Encounter (Signed)
-----   Message from Nori Riis, PA-C sent at 12/04/2019  9:04 AM EDT ----- Please let Mr. Cregg know that his scrotal ultrasound did not show any cancer.  The area that he is feeling is a cyst.  He should continue monthly testicular exams and report any changes or new findings.

## 2019-12-04 NOTE — Telephone Encounter (Signed)
Unable to leave message

## 2019-12-08 NOTE — Telephone Encounter (Signed)
LM for patient to return call.

## 2019-12-09 NOTE — Telephone Encounter (Signed)
Patient notified and voiced understanding.

## 2020-05-23 NOTE — Progress Notes (Signed)
05/24/2020 10:43 AM   Craig [name] 07-Apr-1951 [id number]  Referring provider: Sofie Hartigan, MD [place],  [place] [address]  Chief Complaint  Patient presents with  . Follow-up    1 year follow-up    HPI: [name] is a 69 year old male with erectile dysfunction and BPH with LUTS who presents today for a yearly follow up.    BPH WITH LUTS  (prostate and/or bladder) IPSS score: 22/3   PVR: 0 mL   Previous score: 20/2  Previous PVR: 87  Major complaint(s):  Urgency, leakage of urine and weak/split urinary stream x several years. Denies any dysuria, hematuria or suprapubic pain.   Currently taking: tamsulosin 0.4 mg prn due to its effects on ejaculation   Denies any recent fevers, chills, nausea or vomiting.  His father may have had prostate cancer, but he died at 28.    IPSS    Row Name 05/24/20 0900         International Prostate Symptom Score   How often have you had the sensation of not emptying your bladder? Less than half the time     How often have you had to urinate less than every two hours? Less than 1 in 5 times     How often have you found you stopped and started again several times when you urinated? Almost always     How often have you found it difficult to postpone urination? Almost always     How often have you had a weak urinary stream? Almost always     How often have you had to strain to start urination? Less than 1 in 5 times     How many times did you typically get up at night to urinate? 3 Times     Total IPSS Score 22       Quality of Life due to urinary symptoms   If you were to spend the rest of your life with your urinary condition just the way it is now how would you feel about that? Mixed            Score:  1-7 Mild 8-19 Moderate 20-35 Severe  Erectile dysfunction SHIM score: 21    Previous SHIM score: 22 Risk factors:  age and BPH No painful erections or curvatures with his erections.    No longer having  spontaneous erections Tried:    PDE5i's work well    [place] Name 05/24/20 0925         SHIM: Over the last 6 months:   How do you rate your confidence that you could get and keep an erection? High     When you had erections with sexual stimulation, how often were your erections hard enough for penetration (entering your partner)? Most Times (much more than half the time)     During sexual intercourse, how often were you able to maintain your erection after you had penetrated (entered) your partner? Almost Always or Always     During sexual intercourse, how difficult was it to maintain your erection to completion of intercourse? Not Difficult     When you attempted sexual intercourse, how often was it satisfactory for you? Sometimes (about half the time)       SHIM Total Score   SHIM 21            Score: 1-7 Severe ED 8-11 Moderate ED 12-16 Mild-Moderate ED 17-21 Mild  ED 22-25 No ED  PMH: Past Medical History:  Diagnosis Date  . BPH with obstruction/lower urinary tract symptoms   . Erectile dysfunction   . GERD (gastroesophageal reflux disease)   . Hypogonadism in male   . 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 Vitamin D deficiency     Surgical History: Past Surgical History:  Procedure Laterality Date  . ANTERIOR RELEASE VERTEBRAL BODY W/ POSTERIOR FUSION    . APPENDECTOMY    . CATARACT EXTRACTION W/PHACO Right 08/21/2017   Procedure: CATARACT EXTRACTION PHACO AND INTRAOCULAR LENS PLACEMENT ([place]) RIGHT;  Surgeon: Eulogio Bear, MD;  Location: [facility];  Service: Ophthalmology;  Laterality: Right;  . CATARACT EXTRACTION W/PHACO Left 09/18/2017   Procedure: CATARACT EXTRACTION PHACO AND INTRAOCULAR LENS PLACEMENT ([facility]) LEFT;  Surgeon: Eulogio Bear, MD;  Location: [facility];  Service: Ophthalmology;  Laterality: Left;  . COLONOSCOPY WITH PROPOFOL N/A 12/06/2017   Procedure: COLONOSCOPY WITH PROPOFOL;   Surgeon: Lucilla Lame, MD;  Location: [facility];  Service: Endoscopy;  Laterality: N/A;  . HERNIA REPAIR  1975/1960  . LEG SURGERY Right 2012  . NECK SURGERY    . POLYPECTOMY N/A 12/06/2017   Procedure: POLYPECTOMY;  Surgeon: Lucilla Lame, MD;  Location: [facility];  Service: Endoscopy;  Laterality: N/A;  . ruptured disc    . VARICOCELECTOMY      Home Medications:  Allergies as of 05/24/2020      Reactions   Ivp Dye [iodinated Diagnostic Agents] Anaphylaxis      Medication List       Accurate as of May 24, 2020 10:43 AM. If you have any questions, ask your nurse or doctor.        ibuprofen 200 MG tablet Commonly known as: ADVIL Take 200 mg by mouth 2 (two) times daily. 3 in am 2 at night   omeprazole 40 MG capsule Commonly known as: PRILOSEC Take 20 mg by mouth daily. am   sildenafil 20 MG tablet Commonly known as: REVATIO Take 3 to 5 tablets two hours before intercouse on an empty stomach.  Do not take with nitrates.   tadalafil 5 MG tablet Commonly known as: CIALIS Take 1 tablet (5 mg total) by mouth daily as needed for erectile dysfunction. Started by: Zara Council, PA-C   tamsulosin 0.4 MG Caps capsule Commonly known as: FLOMAX Take 1 capsule (0.4 mg total) by mouth daily.   UNABLE TO FIND Take 1 capsule 2 (two) times daily by mouth. Med Name: Super Beta Prostate       Allergies:  Allergies  Allergen Reactions  . Ivp Dye [Iodinated Diagnostic Agents] Anaphylaxis    Family History: Family History  Problem Relation Age of Onset  . Kidney cancer Neg Hx   . Prostate cancer Neg Hx   . Kidney disease Neg Hx     Social History:  reports that he has quit smoking. His smoking use included cigarettes. He has never used smokeless tobacco. He reports current alcohol use. He reports that he does not use drugs.  ROS: Pertinent ROS in HPI  Physical Exam: BP (!) 153/81   Pulse 70   Ht 5' 8"  (1.727 m)   Wt 199 lb (90.3 kg)   BMI  30.26 kg/m   Constitutional:  Well nourished. Alert and oriented, No acute distress. HEENT: [place] AT, mask in place.  Trachea midline Cardiovascular: No clubbing, cyanosis, or edema. Respiratory: Normal respiratory effort,  no increased work of breathing. GU: No CVA tenderness.  No bladder fullness or masses.  Patient with uncircumcised phallus.  Foreskin easily retracted  Urethral meatus is patent.  No penile discharge. No penile lesions or rashes. Scrotum without lesions, cysts, rashes and/or edema.  Testicles are located scrotally bilaterally. No masses are appreciated in the testicles. Left and right epididymis are normal.  Small epididymal cyst is noted in the right epididymis.   Rectal: Patient with  normal sphincter tone. Anus and perineum without scarring or rashes. No rectal masses are appreciated. Prostate is approximately 50 grams, 1 cm nodule is appreciated in the right lobe.  Seminal vesicles could not be palpated Neurologic: Grossly intact, no focal deficits, moving all 4 extremities. Psychiatric: Normal mood and affect.  Laboratory Data: Lab Results  Component Value Date   TESTOSTERONE 374 05/11/2017   Urinalysis Component     Latest Ref Rng & Units 10/28/2019  Specific Gravity, UA     1.005 - 1.030 >1.030 (H)  pH, UA     5.0 - 7.5 5.5  Color, UA     Yellow Yellow  Appearance Ur     Clear Cloudy (A)  Leukocytes,UA     Negative Negative  Protein,UA     Negative/Trace Negative  Glucose, UA     Negative Negative  Ketones, UA     Negative Negative  RBC, UA     Negative 1+ (A)  Bilirubin, UA     Negative Negative  Urobilinogen, Ur     0.2 - 1.0 mg/dL 0.2  Nitrite, UA     Negative Negative  Microscopic Examination      See below:   Component     Latest Ref Rng & Units 10/28/2019  WBC, UA     0 - 5 /hpf None seen  RBC     0 - 2 /hpf 0-2  Epithelial Cells (non renal)     0 - 10 /hpf None seen  Crystals     N/A Present (A)  Crystal Type     N/A Amorphous  Sediment  Bacteria, UA     None seen/Few Few   Specimen:  Blood  Ref Range & Units 6 mo ago  WBC (White Blood Cell Count) 4.1 - 10.2 10^3/uL 5.4      RBC (Red Blood Cell Count) 4.69 - 6.13 10^6/uL 4.52Low      Hemoglobin 14.1 - 18.1 gm/dL 14.6      Hematocrit 40.0 - 52.0 % 42.1      MCV (Mean Corpuscular Volume) 80.0 - 100.0 fl 93.1      MCH (Mean Corpuscular Hemoglobin) 27.0 - 31.2 pg 32.3High      MCHC (Mean Corpuscular Hemoglobin Concentration) 32.0 - 36.0 gm/dL 34.7      Platelet Count 150 - 450 10^3/uL 190      RDW-CV (Red Cell Distribution Width) 11.6 - 14.8 % 12.8      MPV (Mean Platelet Volume) 9.4 - 12.4 fl 9.8      Neutrophils 1.50 - 7.80 10^3/uL 3.40      Lymphocytes 1.00 - 3.60 10^3/uL 1.40      Mixed Count 0.10 - 0.90 10^3/uL 0.60      Neutrophil % 32.0 - 70.0 % 62.5      Lymphocyte % 10.0 - 50.0 % 26.3      Mixed % 3.0 - 14.4 % 11.2      Resulting Agency  [facility] - [facility]  Specimen Collected: 11/20/19  9:33 AM Last Resulted: 11/20/19 9:56 AM  Received From: [facility]  Result Received: 11/23/19 11:45 AM   Specimen:  Blood  Ref Range & Units 6 mo ago  Glucose 70 - 110 mg/dL 105      Sodium 136 - 145 mmol/L 143      Potassium 3.6 - 5.1 mmol/L 4.8      Chloride 97 - 109 mmol/L 109      Carbon Dioxide (CO2) 22.0 - 32.0 mmol/L 27.6      Urea Nitrogen (BUN) 7 - 25 mg/dL 14      Creatinine 0.7 - 1.3 mg/dL 1.2      Glomerular Filtration Rate (eGFR), MDRD Estimate >60 mL/min/1.73sq m 60Low      Calcium 8.7 - 10.3 mg/dL 9.3      AST  8 - 39 U/L 18      ALT  6 - 57 U/L 18      Alk Phos (alkaline Phosphatase) 34 - 104 U/L 52      Albumin 3.5 - 4.8 g/dL 4.5      Bilirubin, Total 0.3 - 1.2 mg/dL 0.5      Protein, Total 6.1 - 7.9 g/dL 7.0      A/G Ratio 1.0 - 5.0 gm/dL 1.8      Resulting Agency  [facility] - [facility]  Specimen Collected: 11/20/19 9:33 AM Last Resulted: 11/20/19 5:03 PM  Received  From: [facility]  Result Received: 11/23/19 11:45 AM   Specimen:  Blood  Ref Range & Units 6 mo ago  Cholesterol, Total 100 - 200 mg/dL 175      Triglyceride 35 - 199 mg/dL 58      HDL (High Density Lipoprotein) Cholesterol 29.0 - 71.0 mg/dL 56.1      LDL Calculated 0 - 130 mg/dL 107      VLDL Cholesterol mg/dL 12      Cholesterol/HDL Ratio  3.1      Resulting Agency  [facility] - [facility]  Specimen Collected: 11/20/19 9:33 AM Last Resulted: 11/20/19 5:03 PM  Received From: [facility]  Result Received: 11/23/19 11:45 AM   Specimen:  Blood  Ref Range & Units 6 mo ago  Thyroid Stimulating Hormone (TSH) 0.450-5.330 uIU/ml uIU/mL 3.690      Resulting Agency  [facility] - [facility]  Specimen Collected: 11/20/19 9:33 AM Last Resulted: 11/20/19 5:03 PM  Received From: [facility]  Result Received: 11/23/19 11:45 AM     I have reviewed the labs.   Pertinent Imaging: CLINICAL DATA:  Testicular lump on the right-side x1 month.  EXAM: SCROTAL ULTRASOUND  DOPPLER ULTRASOUND OF THE TESTICLES  TECHNIQUE: Complete ultrasound examination of the testicles, epididymis, and other scrotal structures was performed. Color and spectral Doppler ultrasound were also utilized to evaluate blood flow to the testicles.  COMPARISON:  None.  FINDINGS: Right testicle  Measurements: 4.2 x 2 x 2.7 cm. No mass or microlithiasis visualized.  Left testicle  Measurements: 3.4 x 1.9 x 2.9 cm. The left testicle is significantly heterogeneous and hypoechoic and atrophic when compared to the right. Multiple small calcifications are noted without evidence for discrete mass.  Right epididymis: There is a 7 mm epididymal cyst in the tail the epididymis.  Left epididymis:  Normal in size and appearance.  Hydrocele:  None visualized.  Varicocele:  There is a left-sided varicocele.  Pulsed Doppler interrogation  of both testes demonstrates normal low  resistance arterial and venous waveforms bilaterally.  IMPRESSION: 1. The patient's palpable area of concern appears to correspond to a small 7 mm right-sided epididymal tail cyst. 2. No evidence for testicular torsion. 3. Small, heterogeneous left testicle with associated microlithiasis. This may be related to a prior infectious or traumatic insult. There is no discrete testicular mass. Current literature suggests that testicular microlithiasis is not a significant independent risk factor for development of testicular carcinoma, and that follow up imaging is not warranted in the absence of other risk factors. Monthly testicular self-examination and annual physical exams are considered appropriate surveillance. If patient has other risk factors for testicular carcinoma, then referral to Urology should be considered. (Reference: DeCastro, et al.: A 5-Year Follow up Study of Asymptomatic Men with Testicular Microlithiasis. J Urol 2008; 514:6047-9987.) 4. Left-sided varicocele.   Electronically Signed   By: Constance Holster M.D.   On: 12/03/2019 21:27   Assessment & Plan:    1. Prostate nodule - Today's PSA is pending -  I recommended he undergo a prostate biopsy at this time for further evaluation, but after discussing how the procedure was performed and the risks, he asked if there were other alternatives.  We discussed a prostate MRI and how it would further stratify his risk for prostate cancer.  I explained after undergoing an MRI, he would be a candidate for a fusion biopsy.  I explained that the prostate MRI has a 25% chance of missing prostate cancer and that the biopsy would be performed in a similar fashion with similar risks.  He would like to have a prostate MRI at this time and will return for results and further discussion.  He mentioned he definitely does not want prostate "seeds" as he had a friend who had significant side  effects after seeds were placed.    2. Erectile dysfunction - SHIM score is 21, it is slightly worse - Will try tadalafil 5 mg daily - RTC in 12 months for repeat SHIM score and exam   3. BPH with LUTS IPSS score is 22/3, it is worsening Continue conservative management, avoiding bladder irritants and timed voiding's Will try tadalafil 5 mg daily instead of the tamsulosin to see if he can have better control over his urinary problems and not the ejaculatory side effects RTC for prostate MRI results   Return in about 2 weeks (around 06/07/2020) for prostate mri results.  These notes generated with voice recognition software. I apologize for typographical errors.  Zara Council, PA-C  [facility] [address]  [place], [place] [address] [phone]  I spent 30 minutes on the day of the encounter to include pre-visit record review, face-to-face time with the patient, and post-visit ordering of tests.

## 2020-05-24 ENCOUNTER — Other Ambulatory Visit: Payer: Self-pay

## 2020-05-24 ENCOUNTER — Other Ambulatory Visit
Admission: RE | Admit: 2020-05-24 | Discharge: 2020-05-24 | Disposition: A | Discharge status: Home / Self Care | Payer: Medicare PPO | Attending: Urology | Admitting: Urology

## 2020-05-24 ENCOUNTER — Encounter: Payer: Self-pay | Admitting: Urology

## 2020-05-24 ENCOUNTER — Other Ambulatory Visit: Payer: Self-pay | Admitting: *Deleted

## 2020-05-24 ENCOUNTER — Ambulatory Visit (INDEPENDENT_AMBULATORY_CARE_PROVIDER_SITE_OTHER): Payer: Medicare PPO | Admitting: Urology

## 2020-05-24 VITALS — BP 153/81 | HR 70 | Ht 68.0 in | Wt 199.0 lb

## 2020-05-24 DIAGNOSIS — N529 Male erectile dysfunction, unspecified: Secondary | ICD-10-CM | POA: Diagnosis not present

## 2020-05-24 DIAGNOSIS — N138 Other obstructive and reflux uropathy: Secondary | ICD-10-CM | POA: Insufficient documentation

## 2020-05-24 DIAGNOSIS — N402 Nodular prostate without lower urinary tract symptoms: Secondary | ICD-10-CM

## 2020-05-24 DIAGNOSIS — N401 Enlarged prostate with lower urinary tract symptoms: Secondary | ICD-10-CM | POA: Insufficient documentation

## 2020-05-24 LAB — PSA: Prostatic Specific Antigen: 3.9 ng/mL (ref 0.00–4.00)

## 2020-05-24 LAB — BLADDER SCAN AMB NON-IMAGING: Scan Result: 0

## 2020-05-24 MED ORDER — TADALAFIL 5 MG PO TABS: 5.0000 mg | ORAL_TABLET | Freq: Every day | ORAL | 0 refills | Status: DC | PRN

## 2020-05-25 ENCOUNTER — Telehealth: Payer: Self-pay | Admitting: Family Medicine

## 2020-05-25 NOTE — Telephone Encounter (Signed)
-----   Message from Nori Riis, PA-C sent at 05/25/2020  8:04 AM EST ----- Please let Craig [name] know his PSA is 3.90 which is in the normal range, but I still recommend going forward with the prostate MRI.  The folks at MRI said the metal in his neck isn't a problem.

## 2020-05-25 NOTE — Telephone Encounter (Signed)
Patient notified and voiced understanding. He states he is waiting on a call to get the MRI scheduled.

## 2020-06-07 ENCOUNTER — Ambulatory Visit (INDEPENDENT_AMBULATORY_CARE_PROVIDER_SITE_OTHER): Payer: Medicare PPO | Admitting: Urology

## 2020-06-07 ENCOUNTER — Encounter: Payer: Self-pay | Admitting: Urology

## 2020-06-07 ENCOUNTER — Other Ambulatory Visit: Payer: Self-pay

## 2020-06-07 DIAGNOSIS — N402 Nodular prostate without lower urinary tract symptoms: Secondary | ICD-10-CM

## 2020-06-09 ENCOUNTER — Ambulatory Visit
Admission: RE | Admit: 2020-06-09 | Discharge: 2020-06-09 | Disposition: A | Discharge status: Home / Self Care | Payer: Medicare PPO | Source: Ambulatory Visit | Attending: Urology | Admitting: Urology

## 2020-06-09 ENCOUNTER — Other Ambulatory Visit: Payer: Self-pay

## 2020-06-09 DIAGNOSIS — N402 Nodular prostate without lower urinary tract symptoms: Secondary | ICD-10-CM | POA: Diagnosis present

## 2020-06-09 MED ORDER — GADOBUTROL 1 MMOL/ML IV SOLN
9.0000 mL | Freq: Once | INTRAVENOUS | Status: AC | PRN
  Administered 2020-06-09: 9 mL via INTRAVENOUS

## 2020-06-09 NOTE — Progress Notes (Signed)
Patient rescheduled as he did not have his prostate MRI completed at the time of this visit.

## 2020-06-13 NOTE — Progress Notes (Signed)
06/14/2020 6:25 PM   Craig [name] [id number]  Referring provider: Sofie Hartigan, MD [name],  [place] [address]  Chief Complaint  Patient presents with  . Results    MRI results    HPI: Craig [name] is a 69 year old male with erectile dysfunction, BPH with LUTS and prostate nodule who presents today to discuss his prostate MRI results.  Prostate MRI completed 06/09/2020 - no high-grade carcinoma identified in the peripheral zone.  Linear striations in the posterior RIGHT mid gland are favored benign scarring ( PI-RADS: 2).  Nodular transitional zone most consistent with benign prostate hypertrophy ( PI-RADS: 2).  One hyperplastic nodule protrudes from the posterior RIGHT base.   PSAD 0.075  Component     Latest Ref Rng & Units 05/11/2017 05/16/2019 05/24/2020  Prostatic Specific Antigen     0.00 - 4.00 ng/mL 3.83 3.65 3.90    PMH: Past Medical History:  Diagnosis Date  . BPH with obstruction/lower urinary tract symptoms   . Erectile dysfunction   . GERD (gastroesophageal reflux disease)   . Hypogonadism in male   . 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 Vitamin D deficiency     Surgical History: Past Surgical History:  Procedure Laterality Date  . ANTERIOR RELEASE VERTEBRAL BODY W/ POSTERIOR FUSION    . APPENDECTOMY    . CATARACT EXTRACTION W/PHACO Right 08/21/2017   Procedure: CATARACT EXTRACTION PHACO AND INTRAOCULAR LENS PLACEMENT ([place]) RIGHT;  Surgeon: Eulogio Bear, MD;  Location: [facility];  Service: Ophthalmology;  Laterality: Right;  . CATARACT EXTRACTION W/PHACO Left 09/18/2017   Procedure: CATARACT EXTRACTION PHACO AND INTRAOCULAR LENS PLACEMENT ([facility]) LEFT;  Surgeon: Eulogio Bear, MD;  Location: [facility];  Service: Ophthalmology;  Laterality: Left;  . COLONOSCOPY WITH PROPOFOL N/A 12/06/2017   Procedure: COLONOSCOPY WITH PROPOFOL;  Surgeon: Lucilla Lame, MD;   Location: [facility];  Service: Endoscopy;  Laterality: N/A;  . HERNIA REPAIR  1975/1960  . LEG SURGERY Right 2012  . NECK SURGERY    . POLYPECTOMY N/A 12/06/2017   Procedure: POLYPECTOMY;  Surgeon: Lucilla Lame, MD;  Location: [facility];  Service: Endoscopy;  Laterality: N/A;  . ruptured disc    . VARICOCELECTOMY      Home Medications:  Allergies as of 06/14/2020      Reactions   Ivp Dye [iodinated Diagnostic Agents] Anaphylaxis      Medication List       Accurate as of June 14, 2020 11:59 PM. If you have any questions, ask your nurse or doctor.        STOP taking these medications   sildenafil 20 MG tablet Commonly known as: REVATIO Stopped by: Dany Harten, PA-C     TAKE these medications   ibuprofen 200 MG tablet Commonly known as: ADVIL Take 200 mg by mouth 2 (two) times daily. 3 in am 2 at night   omeprazole 40 MG capsule Commonly known as: PRILOSEC Take 20 mg by mouth daily. am   tadalafil 5 MG tablet Commonly known as: CIALIS Take 1 tablet (5 mg total) by mouth daily as needed for erectile dysfunction.   tamsulosin 0.4 MG Caps capsule Commonly known as: FLOMAX Take 1 capsule (0.4 mg total) by mouth daily.   UNABLE TO FIND Take 1 capsule by mouth 2 (two) times daily. Med Name: Super Beta Prostate       Allergies:  Allergies  Allergen Reactions  . Ivp Dye [Iodinated Diagnostic Agents] Anaphylaxis    Family History: Family History  Problem Relation Age of Onset  . Kidney cancer Neg Hx   . Prostate cancer Neg Hx   . Kidney disease Neg Hx     Social History:  reports that he has quit smoking. His smoking use included cigarettes. He has never used smokeless tobacco. He reports current alcohol use. He reports that he does not use drugs.  ROS: Pertinent ROS in HPI  Physical Exam: BP (!) 144/61   Pulse (!) 56   Ht 5' 8" (1.727 m)   Wt 200 lb (90.7 kg)   BMI 30.41 kg/m   Constitutional:  Well nourished. Alert and  oriented, No acute distress. HEENT: [place] AT, mask in place.  Trachea midline Cardiovascular: No clubbing, cyanosis, or edema. Respiratory: Normal respiratory effort, no increased work of breathing. Neurologic: Grossly intact, no focal deficits, moving all 4 extremities. Psychiatric: Normal mood and affect.  Laboratory Data: Lab Results  Component Value Date   TESTOSTERONE 374 05/11/2017   Urinalysis Component     Latest Ref Rng & Units 10/28/2019  Specific Gravity, UA     1.005 - 1.030 >1.030 (H)  pH, UA     5.0 - 7.5 5.5  Color, UA     Yellow Yellow  Appearance Ur     Clear Cloudy (A)  Leukocytes,UA     Negative Negative  Protein,UA     Negative/Trace Negative  Glucose, UA     Negative Negative  Ketones, UA     Negative Negative  RBC, UA     Negative 1+ (A)  Bilirubin, UA     Negative Negative  Urobilinogen, Ur     0.2 - 1.0 mg/dL 0.2  Nitrite, UA     Negative Negative  Microscopic Examination      See below:   Component     Latest Ref Rng & Units 10/28/2019  WBC, UA     0 - 5 /hpf None seen  RBC     0 - 2 /hpf 0-2  Epithelial Cells (non renal)     0 - 10 /hpf None seen  Crystals     N/A Present (A)  Crystal Type     N/A Amorphous Sediment  Bacteria, UA     None seen/Few Few   Specimen:  Blood  Ref Range & Units 6 mo ago  WBC (White Blood Cell Count) 4.1 - 10.2 10^3/uL 5.4      RBC (Red Blood Cell Count) 4.69 - 6.13 10^6/uL 4.52Low      Hemoglobin 14.1 - 18.1 gm/dL 14.6      Hematocrit 40.0 - 52.0 % 42.1      MCV (Mean Corpuscular Volume) 80.0 - 100.0 fl 93.1      MCH (Mean Corpuscular Hemoglobin) 27.0 - 31.2 pg 32.3High      MCHC (Mean Corpuscular Hemoglobin Concentration) 32.0 - 36.0 gm/dL 34.7      Platelet Count 150 - 450 10^3/uL 190      RDW-CV (Red Cell Distribution Width) 11.6 - 14.8 % 12.8      MPV (Mean Platelet Volume) 9.4 - 12.4 fl 9.8      Neutrophils 1.50 - 7.80 10^3/uL 3.40      Lymphocytes 1.00 - 3.60 10^3/uL 1.40       Mixed Count 0.10 - 0.90 10^3/uL 0.60      Neutrophil % 32.0 - 70.0 % 62.5        Lymphocyte % 10.0 - 50.0 % 26.3      Mixed % 3.0 - 14.4 % 11.2      Resulting Agency  [facility] - LAB  Specimen Collected: 11/20/19 9:33 AM Last Resulted: 11/20/19 9:56 AM  Received From: [facility]  Result Received: 11/23/19 11:45 AM   Specimen:  Blood  Ref Range & Units 6 mo ago  Glucose 70 - 110 mg/dL 105      Sodium 136 - 145 mmol/L 143      Potassium 3.6 - 5.1 mmol/L 4.8      Chloride 97 - 109 mmol/L 109      Carbon Dioxide (CO2) 22.0 - 32.0 mmol/L 27.6      Urea Nitrogen (BUN) 7 - 25 mg/dL 14      Creatinine 0.7 - 1.3 mg/dL 1.2      Glomerular Filtration Rate (eGFR), MDRD Estimate >60 mL/min/1.73sq m 60Low      Calcium 8.7 - 10.3 mg/dL 9.3      AST  8 - 39 U/L 18      ALT  6 - 57 U/L 18      Alk Phos (alkaline Phosphatase) 34 - 104 U/L 52      Albumin 3.5 - 4.8 g/dL 4.5      Bilirubin, Total 0.3 - 1.2 mg/dL 0.5      Protein, Total 6.1 - 7.9 g/dL 7.0      A/G Ratio 1.0 - 5.0 gm/dL 1.8      Resulting Agency  [facility] - [facility]  Specimen Collected: 11/20/19 9:33 AM Last Resulted: 11/20/19 5:03 PM  Received From: [facility]  Result Received: 11/23/19 11:45 AM   Specimen:  Blood  Ref Range & Units 6 mo ago  Cholesterol, Total 100 - 200 mg/dL 175      Triglyceride 35 - 199 mg/dL 58      HDL (High Density Lipoprotein) Cholesterol 29.0 - 71.0 mg/dL 56.1      LDL Calculated 0 - 130 mg/dL 107      VLDL Cholesterol mg/dL 12      Cholesterol/HDL Ratio  3.1      Resulting Agency  [facility] - [facility]  Specimen Collected: 11/20/19 9:33 AM Last Resulted: 11/20/19 5:03 PM  Received From: [facility]  Result Received: 11/23/19 11:45 AM   Specimen:  Blood  Ref Range & Units 6 mo ago  Thyroid Stimulating Hormone (TSH) 0.450-5.330 uIU/ml uIU/mL 3.690      Resulting Agency  [facility] - [facility]  Specimen Collected: 11/20/19 9:33 AM Last Resulted: 11/20/19 5:03 PM  Received From: [facility]  Result Received: 11/23/19 11:45 AM     I have reviewed the labs.   Pertinent Imaging: CLINICAL DATA:  Enlarged prostate nodule. Elevated PSA. 70 year old male  EXAM: MR PROSTATE WITHOUT AND WITH CONTRAST  TECHNIQUE: Multiplanar multisequence MRI images were obtained of the pelvis centered about the prostate. Pre and post contrast images were obtained.  CONTRAST:  70m GADAVIST GADOBUTROL 1 MMOL/ML IV SOLN  COMPARISON:  None.  FINDINGS: Prostate: Normal high signal intensity within the peripheral zone on T2 weighted imaging. One band of low signal intensity is present in the posterior RIGHT mid gland (image 60/2) which is favor scarring.  No clear foci of restricted diffusion within the peripheral zone (series 6 and series 7).  No abnormal enhancement.  The transitional zone is enlarged by capsulated nodules. One  nodule does protrude from the posterior RIGHT aspect of the RIGHT base measuring 12 mm on image 12/series 4.  Prostatic capsule is intact. RIGHT Seminal vesicles is normal. The LEFT Seminal vesicle is small.  Volume: 4.7 by 4.7 by 4.5 cm (volume = 52 cm^3)  Transcapsular spread:  Absent  Seminal vesicle involvement: Absent  Neurovascular bundle involvement: Absent  Pelvic adenopathy: Absent  Bone metastasis: Absent  Other findings: None  IMPRESSION: 1. No high-grade carcinoma identified in the peripheral zone. 2. Linear striations in the posterior RIGHT mid gland are favored benign scarring ( PI-RADS: 2). 3. Nodular transitional zone most consistent with benign prostate hypertrophy ( PI-RADS: 2) 4. One hyperplastic nodule protrudes from the posterior RIGHT base.   Electronically Signed   By: Stewart  Edmunds M.D.   On: 06/09/2020 15:04 I have independently reviewed the films.  See HPI.    Assessment & Plan:    1. Prostate nodule We discussed the results of his prostate MRI today.  I discussed with the patient the approximately 15-20% false negative rate on MRI.  We also discussed his enlarged prostate with PSA density of 0.075.  He is comfortable with the false negative risk and does not want to proceed with a biopsy at this time. He will return in 6 months for repeat PSA and recheck on his nodule.  If there is significant changes in either parameters, we will proceed with a prostate biopsy at that time.  2. BPH with LU TS Patient is at goal with Cialis 5 mg daily He will still take the tamsulosin prn  3. ED Cialis 5 mg has solved the issue with ejaculatory disorder  Return in about 6 months (around 12/13/2020) for IPSS, SHIM, PSA and exam.  These notes generated with voice recognition software. I apologize for typographical errors.  SHANNON MCGOWAN, PA-C  [facility] [address]  [address] [place], [place] [address] [phone]   

## 2020-06-14 ENCOUNTER — Encounter: Payer: Self-pay | Admitting: Urology

## 2020-06-14 ENCOUNTER — Other Ambulatory Visit: Payer: Self-pay

## 2020-06-14 ENCOUNTER — Ambulatory Visit: Payer: Medicare PPO | Admitting: Urology

## 2020-06-14 VITALS — BP 144/61 | HR 56 | Ht 68.0 in | Wt 200.0 lb

## 2020-06-14 DIAGNOSIS — N402 Nodular prostate without lower urinary tract symptoms: Secondary | ICD-10-CM | POA: Diagnosis not present

## 2020-06-14 MED ORDER — TADALAFIL 5 MG PO TABS: 5.0000 mg | ORAL_TABLET | Freq: Every day | ORAL | 3 refills | Status: DC | PRN

## 2020-06-20 IMAGING — US US SCROTUM W/ DOPPLER COMPLETE
1 series · 13 of 25 positions shown · non-contrast
Comparison: None.

CLINICAL DATA: Testicular lump on the right-side x1 month.

EXAM:
SCROTAL ULTRASOUND
DOPPLER ULTRASOUND OF THE TESTICLES
TECHNIQUE: Complete ultrasound examination of the testicles, epididymis, and
other scrotal structures was performed. Color and spectral Doppler
ultrasound were also utilized to evaluate blood flow to the
testicles.

[Series 1: us scrotum w/ doppler complete · 0.07mm/px · 72 acquisitions, 13 frames shown]
[im 1/72]
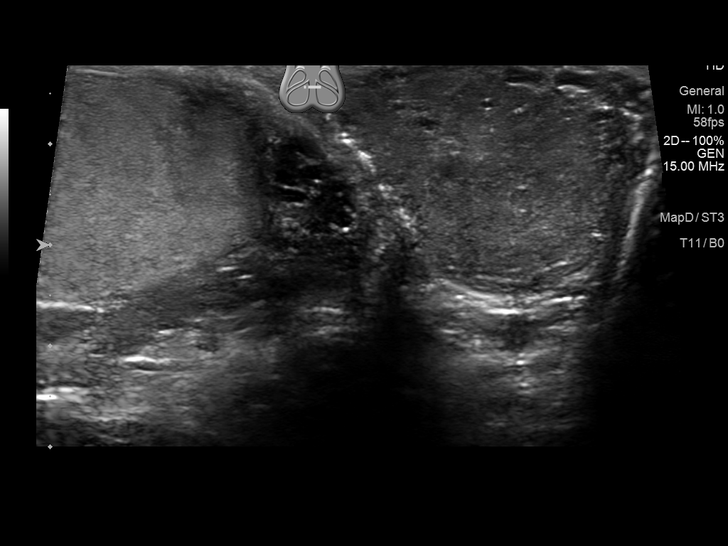
[im 6/72]
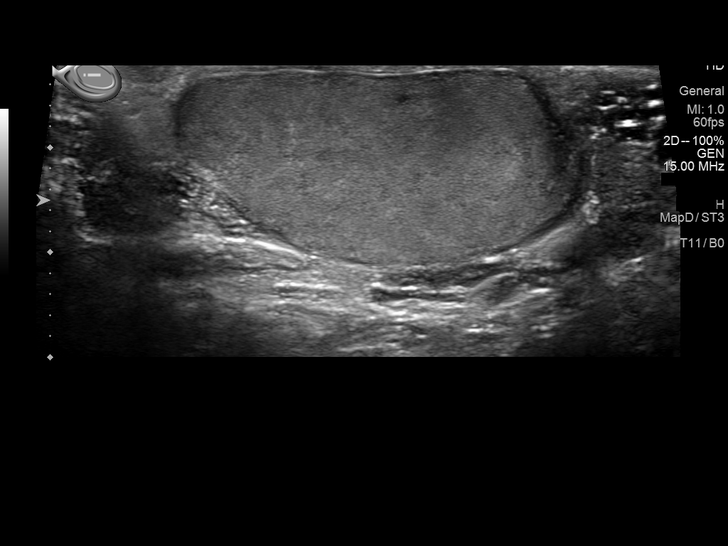
[im 12/72]
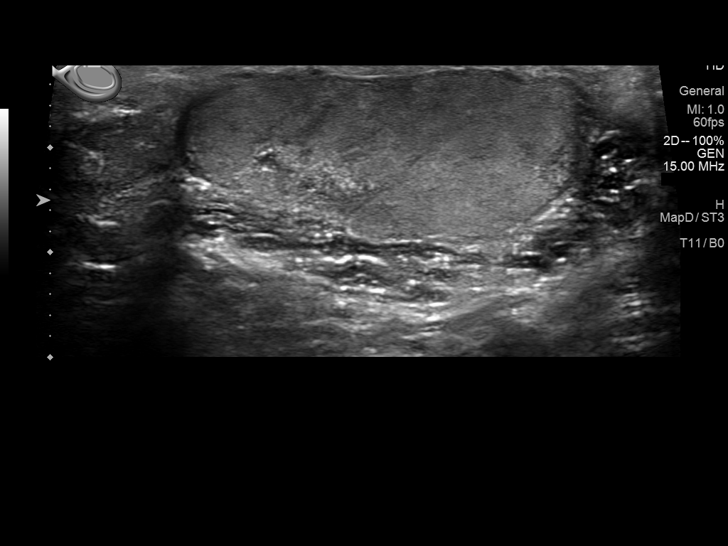
[im 18/72]
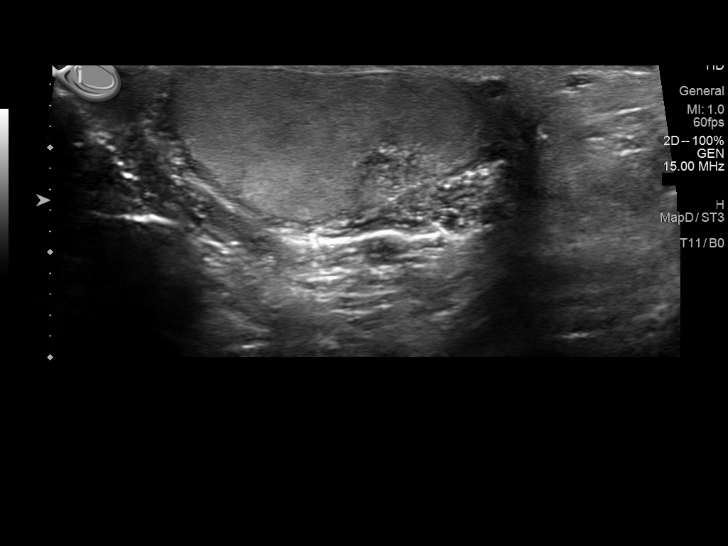
[im 24/72]
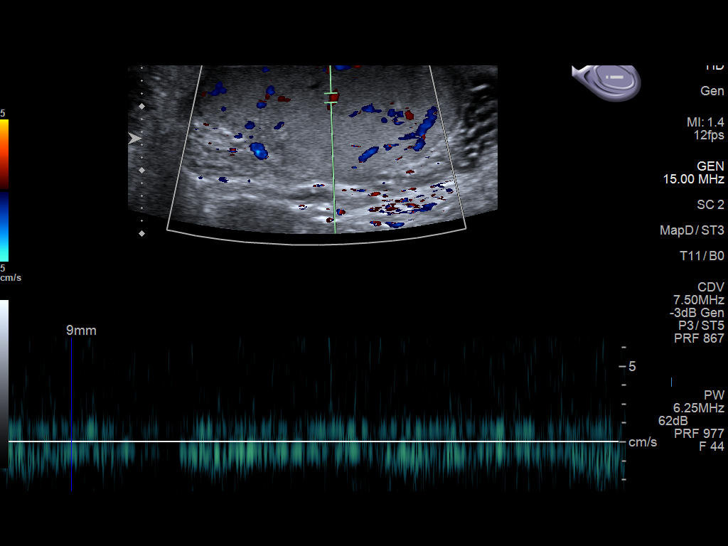
[im 30/72]
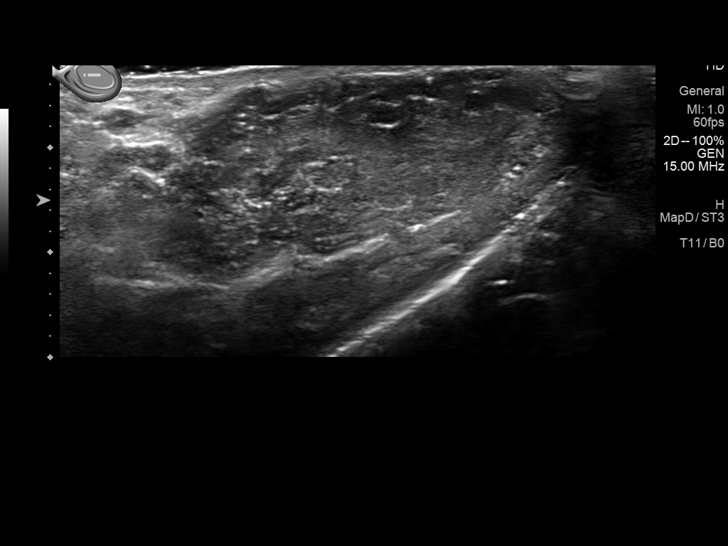
[im 36/72]
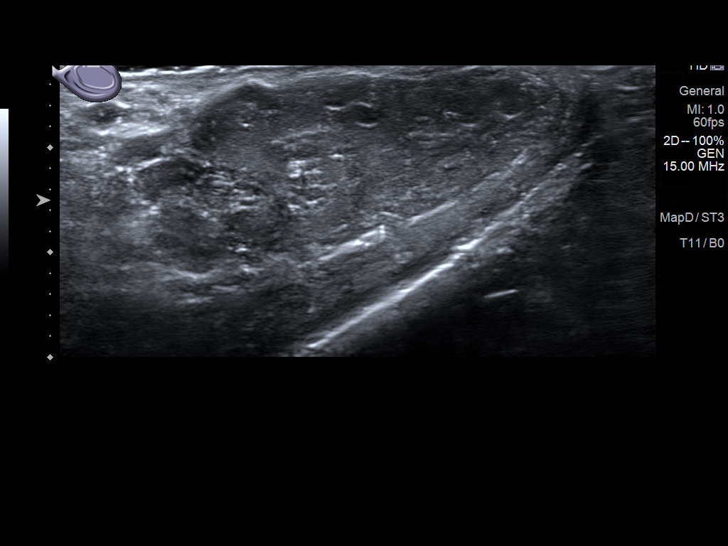
[im 42/72]
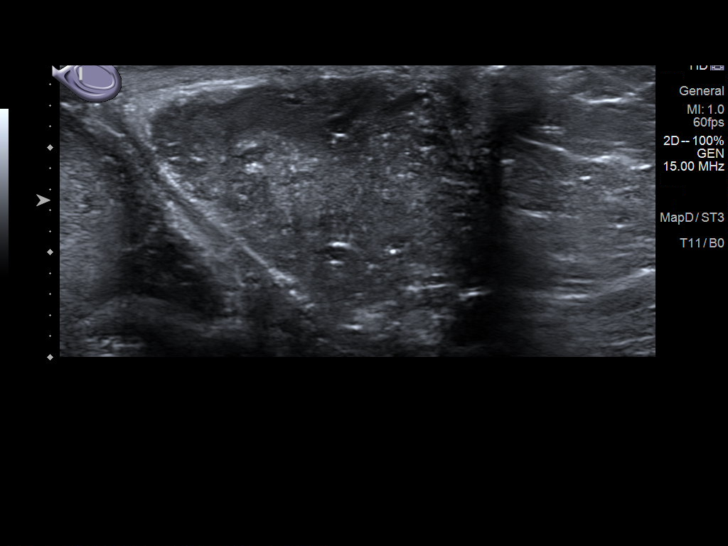
[im 48/72]
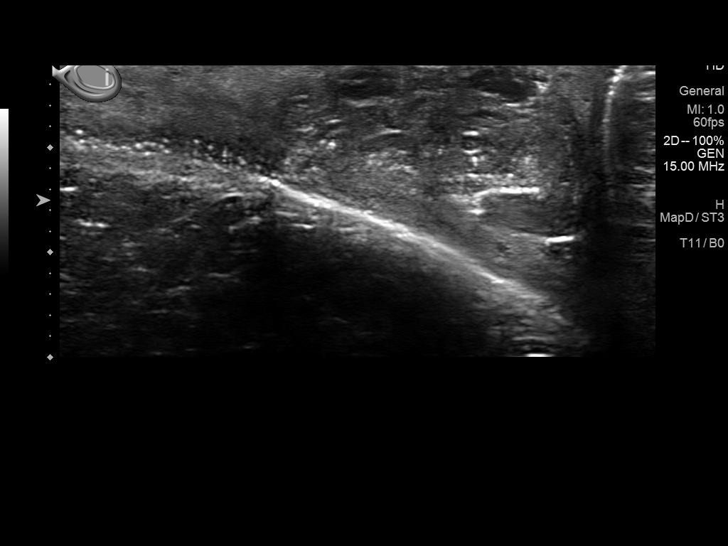
[im 54/72]
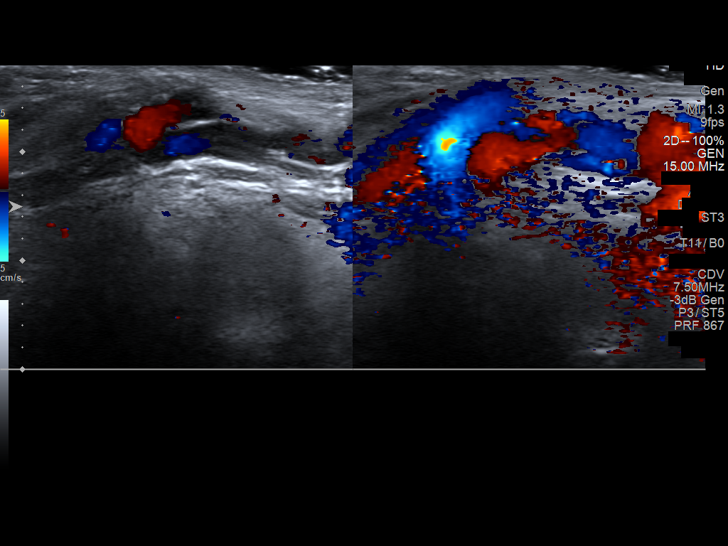
[im 60/72]
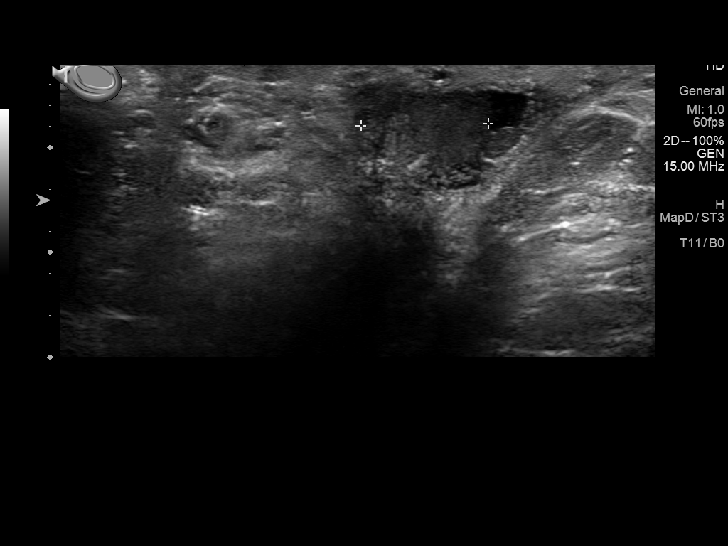
[im 66/72]
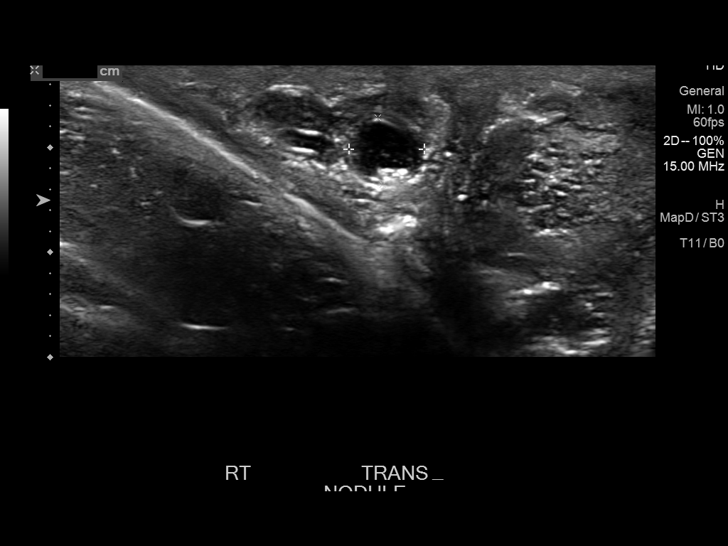
[im 72/72]
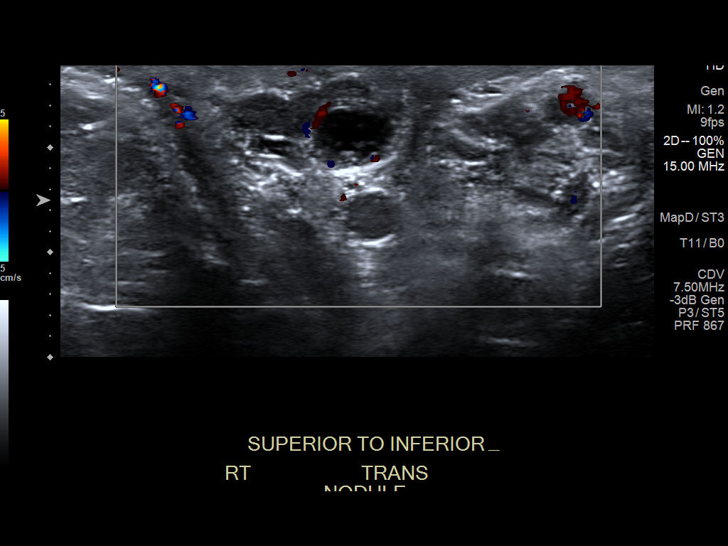

[13 of 25 positions shown; findings below may reference images not displayed]

FINDINGS: Right testicle

Measurements: 4.2 x 2 x 2.7 cm. No mass or microlithiasis
visualized.

Left testicle

Measurements: 3.4 x 1.9 x 2.9 cm. The left testicle is
significantly heterogeneous and hypoechoic and atrophic when
compared to the right. Multiple small calcifications are noted
without evidence for discrete mass.

Right epididymis: There is a 7 mm epididymal cyst in the tail the
epididymis.

Left epididymis:  Normal in size and appearance.

Hydrocele:  None visualized.

Varicocele:  There is a left-sided varicocele.

Pulsed Doppler interrogation of both testes demonstrates normal low
resistance arterial and venous waveforms bilaterally.
IMPRESSION: 1. The patient's palpable area of concern appears to correspond to a
small 7 mm right-sided epididymal tail cyst.
2. No evidence for testicular torsion.
3. Small, heterogeneous left testicle with associated
microlithiasis. This may be related to a prior infectious or
traumatic insult. There is no discrete testicular mass. Current
literature suggests that testicular microlithiasis is not a
significant independent risk factor for development of testicular
carcinoma, and that follow up imaging is not warranted in the
absence of other risk factors. Monthly testicular self-examination
and annual physical exams are considered appropriate surveillance.
If patient has other risk factors for testicular carcinoma, then
referral to Urology should be considered. (Reference: Akoto, et
al.: A 5-Year Follow up Study of Asymptomatic Men with Testicular
Microlithiasis. J Urol 9998; [id number].)
4. Left-sided varicocele.

## 2020-12-12 NOTE — Progress Notes (Signed)
12/13/2020 11:10 AM   Craig [name] Oct 15, 1950 [id number]  Referring provider: Sofie Hartigan, MD [address] [place],  [place] [address]  Urological history: 1. ED -contributing factors of age, BPH and history of smoking  -SHIM 23 -managed with tadalafil 5 mg daily   2. BPH with LU TS -prostate volume 52 cc on 2021 prostate MRI -I PSS 15/2 -managed with tadalafil 5 mg daily and tamsulosin 0.4 mg every three days   3. Prostate nodule -found incidentally on exam 1 cm nodule is appreciated in the right lobe -Prostate MRI completed 06/09/2020 - no high-grade carcinoma identified in the peripheral zone.  Linear striations in the posterior RIGHT mid gland are favored benign scarring ( PI-RADS: 2).  Nodular transitional zone most consistent with benign prostate hypertrophy ( PI-RADS: 2).  One hyperplastic nodule protrudes from the posterior RIGHT base.   PSAD 0.075  4. Family history of prostate cancer -father with non-fatal prostate cancer died at the age of 78   5. Epididymal cyst -scrotal ultrasound 2021 small 7 mm right-sided epididymal tail cyst  6. Nephrolithiasis -spontaneous passage in 2008  Chief Complaint  Patient presents with   Follow-up    75mh follow-up     HPI: Craig RADLEis a 70y.o. male who presents today for 6 month follow up.    His nocturia has improved with the addition of tadalafil.  He is getting up once a night vs two.  Patient denies any modifying or aggravating factors.  Patient denies any gross hematuria, dysuria or suprapubic/flank pain.  Patient denies any fevers, chills, nausea or vomiting.     IPSS     Row Name 12/13/20 1000         International Prostate Symptom Score   How often have you had the sensation of not emptying your bladder? Less than half the time     How often have you had to urinate less than every two hours? Less than half the time     How often have you found you stopped and started again several times  when you urinated? About half the time     How often have you found it difficult to postpone urination? More than half the time     How often have you had a weak urinary stream? About half the time     How often have you had to strain to start urination? Not at All     How many times did you typically get up at night to urinate? 1 Time     Total IPSS Score 15           Quality of Life due to urinary symptoms     If you were to spend the rest of your life with your urinary condition just the way it is now how would you feel about that? Mostly Satisfied             Score:  1-7 Mild 8-19 Moderate 20-35 Severe   Patient still having spontaneous erections on occasion.  He denies any pain or curvature with erections.     SClintonName 12/13/20 1048         SHIM: Over the last 6 months:   How do you rate your confidence that you could get and keep an erection? High     When you had erections with sexual stimulation, how often were your erections hard enough for penetration (entering  your partner)? Most Times (much more than half the time)     During sexual intercourse, how often were you able to maintain your erection after you had penetrated (entered) your partner? Almost Always or Always     During sexual intercourse, how difficult was it to maintain your erection to completion of intercourse? Not Difficult     When you attempted sexual intercourse, how often was it satisfactory for you? 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   Vitamin D deficiency     Surgical History: Past  Surgical History:  Procedure Laterality Date   ANTERIOR RELEASE VERTEBRAL BODY W/ POSTERIOR FUSION     APPENDECTOMY     CATARACT EXTRACTION W/PHACO Right 08/21/2017   Procedure: CATARACT EXTRACTION PHACO AND INTRAOCULAR LENS PLACEMENT ([place]) RIGHT;  Surgeon: Eulogio Bear, MD;  Location: [facility];  Service: Ophthalmology;  Laterality: Right;   CATARACT EXTRACTION W/PHACO Left 09/18/2017   Procedure: CATARACT EXTRACTION PHACO AND INTRAOCULAR LENS PLACEMENT ([place]) LEFT;  Surgeon: Eulogio Bear, MD;  Location: [facility];  Service: Ophthalmology;  Laterality: Left;   COLONOSCOPY WITH PROPOFOL N/A 12/06/2017   Procedure: COLONOSCOPY WITH PROPOFOL;  Surgeon: Lucilla Lame, MD;  Location: [facility];  Service: Endoscopy;  Laterality: N/A;   HERNIA REPAIR  1975/1960   LEG SURGERY Right 2012   NECK SURGERY     POLYPECTOMY N/A 12/06/2017   Procedure: POLYPECTOMY;  Surgeon: Lucilla Lame, MD;  Location: [facility];  Service: Endoscopy;  Laterality: N/A;   ruptured disc     VARICOCELECTOMY      Home Medications:  Allergies as of 12/13/2020       Reactions   Ivp Dye [iodinated Diagnostic Agents] Anaphylaxis        Medication List        Accurate as of December 13, 2020 11:10 AM. If you have any questions, ask your nurse or doctor.          ibuprofen 200 MG tablet Commonly known as: ADVIL Take 200 mg by mouth 2 (two) times daily. 3 in am 2 at night   omeprazole 40 MG capsule Commonly known as: PRILOSEC Take 20 mg by mouth daily. am   tadalafil 5 MG tablet Commonly known as: CIALIS Take 1 tablet (5 mg total) by mouth daily as needed for erectile dysfunction.   tamsulosin 0.4 MG Caps capsule Commonly known as: FLOMAX Take 1 capsule (0.4 mg total) by mouth daily.   UNABLE TO FIND Take 1 capsule by mouth 2 (two) times daily. Med Name: Super Beta Prostate        Allergies:  Allergies  Allergen Reactions   Ivp Dye [Iodinated Diagnostic  Agents] Anaphylaxis    Family History: Family History  Problem Relation Age of Onset   Kidney cancer Neg Hx    Prostate cancer Neg Hx    Kidney disease Neg Hx     Social History:  reports that he has quit smoking. His smoking use included cigarettes. He has never used smokeless tobacco. He reports current alcohol use. He reports that he does not use drugs.  ROS: Pertinent ROS in HPI  Physical Exam: BP 122/74   Pulse (!) 58   Ht 5' 8"  (1.727 m)   Wt 200 lb (90.7 kg)   BMI 30.41 kg/m   Constitutional:  Well nourished. Alert and oriented, No acute distress. HEENT: [place] AT, mask in place  Trachea midline Cardiovascular: No clubbing, cyanosis, or edema. Respiratory: Normal respiratory effort, no increased work of breathing. GU: No CVA tenderness.  No bladder fullness or masses.  Patient with uncircumcised phallus.  Foreskin easily retracted  Urethral meatus is patent.  No penile discharge. No penile lesions or rashes. Scrotum without lesions, cysts, rashes and/or edema.  Testicles are located scrotally bilaterally. No masses are appreciated in the testicles. Left and right epididymis are normal.  Testicle are atrophic.  Left varicocele is noted.   Rectal: Patient with  normal sphincter tone. Anus and perineum without scarring or rashes. No rectal masses are appreciated. Prostate is approximately 50 +grams, could only palpate the apex and midportion of the gland, no nodules are appreciated. Seminal vesicles could not be palpated.  Lymph: No inguinal adenopathy. Neurologic: Grossly intact, no focal deficits, moving all 4 extremities. Psychiatric: Normal mood and affect.   Laboratory Data: WBC (White Blood Cell Count) 4.1 - 10.2 10^3/uL 6.0   RBC (Red Blood Cell Count) 4.69 - 6.13 10^6/uL 4.60 Low    Hemoglobin 14.1 - 18.1 gm/dL 14.5   Hematocrit 40.0 - 52.0 % 42.3   MCV (Mean Corpuscular Volume) 80.0 - 100.0 fl 92.0   MCH (Mean Corpuscular Hemoglobin) 27.0 - 31.2 pg 31.5 High    MCHC  (Mean Corpuscular Hemoglobin Concentration) 32.0 - 36.0 gm/dL 34.3   Platelet Count 150 - 450 10^3/uL 167   RDW-CV (Red Cell Distribution Width) 11.6 - 14.8 % 12.9   MPV (Mean Platelet Volume) 9.4 - 12.4 fl 11.2   Neutrophils 1.50 - 7.80 10^3/uL 3.90   Lymphocytes 1.00 - 3.60 10^3/uL 1.30   Mixed Count 0.10 - 0.90 10^3/uL 0.80   Neutrophil % 32.0 - 70.0 % 64.3   Lymphocyte % 10.0 - 50.0 % 22.1   Mixed % 3.0 - 14.4 % 13.6   Resulting Agency  [facility] - LAB  Specimen Collected: 11/23/20 09:49 Last Resulted: 11/23/20 10:43  Received From: [facility]  Result Received: 12/12/20 13:28   Thyroid Stimulating Hormone (TSH) 0.450-5.330 uIU/ml uIU/mL 4.450   Resulting Agency  [facility] - LAB  Specimen Collected: 11/23/20 09:49 Last Resulted: 11/23/20 16:02  Received From: [facility]  Result Received: 12/12/20 13:28   Cholesterol, Total 100 - 200 mg/dL 175   Triglyceride 35 - 199 mg/dL 94   HDL (High Density Lipoprotein) Cholesterol 29.0 - 71.0 mg/dL 54.3   LDL Calculated 0 - 130 mg/dL 102   VLDL Cholesterol mg/dL 19   Cholesterol/HDL Ratio  3.2   Resulting Agency  [facility] - LAB  Specimen Collected: 11/23/20 09:49 Last Resulted: 11/23/20 16:02  Received From: [facility]  Result Received: 12/12/20 13:28   Glucose 70 - 110 mg/dL 92   Sodium 136 - 145 mmol/L 143   Potassium 3.6 - 5.1 mmol/L 4.3   Chloride 97 - 109 mmol/L 109   Carbon Dioxide (CO2) 22.0 - 32.0 mmol/L 26.8   Urea Nitrogen (BUN)  7 - 25 mg/dL 16   Creatinine 0.7 - 1.3 mg/dL 1.3   Glomerular Filtration Rate (eGFR), MDRD Estimate >60 mL/min/1.73sq m 55 Low    Calcium 8.7 - 10.3 mg/dL 9.3   AST  8 - 39 U/L 16   ALT  6 - 57 U/L 18   Alk Phos (alkaline Phosphatase) 34 - 104 U/L 48   Albumin 3.5 - 4.8 g/dL 4.6   Bilirubin, Total 0.3 - 1.2 mg/dL 0.7   Protein, Total 6.1 - 7.9 g/dL 7.2   A/G Ratio 1.0 - 5.0 gm/dL 1.8   Resulting Agency   [facility] - LAB  Specimen Collected: 11/23/20 09:49 Last Resulted: 11/23/20 16:02  Received From: [facility]  Result Received: 12/12/20 13:28  I have reviewed the labs.   Pertinent Imaging: No recent imaging  Assessment & Plan:    1. Prostate nodule -not appreciated on today's exam -PSA pending  2. BPH with LU TS -at goal with tadalafil 5 mg daily and tamsulosin 0.4 mg every three days-refills given  3. ED -continue the tadalafil 5 mg -refill given   Return in about 1 year (around 12/13/2021) for IPSS, SHIM, PSA and exam.  These notes generated with voice recognition software. I apologize for typographical errors.  Zara Council, PA-C  [facility] [address]  [place], [place] [address] [phone]

## 2020-12-13 ENCOUNTER — Encounter: Payer: Self-pay | Admitting: Urology

## 2020-12-13 ENCOUNTER — Other Ambulatory Visit
Admission: RE | Admit: 2020-12-13 | Discharge: 2020-12-13 | Disposition: A | Discharge status: Home / Self Care | Payer: Medicare PPO | Attending: Urology | Admitting: Urology

## 2020-12-13 ENCOUNTER — Other Ambulatory Visit: Payer: Self-pay

## 2020-12-13 ENCOUNTER — Ambulatory Visit (INDEPENDENT_AMBULATORY_CARE_PROVIDER_SITE_OTHER): Payer: Medicare PPO | Admitting: Urology

## 2020-12-13 VITALS — BP 122/74 | HR 58 | Ht 68.0 in | Wt 200.0 lb

## 2020-12-13 DIAGNOSIS — N401 Enlarged prostate with lower urinary tract symptoms: Secondary | ICD-10-CM | POA: Diagnosis not present

## 2020-12-13 DIAGNOSIS — N402 Nodular prostate without lower urinary tract symptoms: Secondary | ICD-10-CM

## 2020-12-13 DIAGNOSIS — N529 Male erectile dysfunction, unspecified: Secondary | ICD-10-CM | POA: Diagnosis not present

## 2020-12-13 DIAGNOSIS — N138 Other obstructive and reflux uropathy: Secondary | ICD-10-CM

## 2020-12-13 LAB — PSA: Prostatic Specific Antigen: 4.13 ng/mL — ABNORMAL HIGH (ref 0.00–4.00)

## 2020-12-13 MED ORDER — TAMSULOSIN HCL 0.4 MG PO CAPS: 0.4000 mg | ORAL_CAPSULE | Freq: Every day | ORAL | 3 refills | Status: DC

## 2020-12-13 MED ORDER — TADALAFIL 5 MG PO TABS: 5.0000 mg | ORAL_TABLET | Freq: Every day | ORAL | 3 refills | Status: DC | PRN

## 2020-12-14 ENCOUNTER — Telehealth: Payer: Self-pay | Admitting: *Deleted

## 2020-12-14 DIAGNOSIS — N402 Nodular prostate without lower urinary tract symptoms: Secondary | ICD-10-CM

## 2020-12-14 NOTE — Telephone Encounter (Signed)
Please let Craig [name] know that his PSA has jumped up a bit and I would liketo have it repeated in 3 months.   Notified patient as instructed, patient pleased. Discussed follow-up appointments, patient agrees. Psa order

## 2020-12-26 IMAGING — MR MR PROSTATE WO/W CM
56 series · 56 of 56 positions shown · IV contrast (9ml Gadavist)
Comparison: None.

CLINICAL DATA: Enlarged prostate nodule. Elevated PSA. 68-year-old
male

EXAM:
MR PROSTATE WITHOUT AND WITH CONTRAST
TECHNIQUE: Multiplanar multisequence MRI images were obtained of the pelvis
centered about the prostate. Pre and post contrast images were
obtained.
CONTRAST:  9mL GADAVIST GADOBUTROL 1 MMOL/ML IV SOLN

[Series 3: ax in&out whole · axial · 6.0mm · 0.74mm/px · 1 of 35 slices shown (1 of 2)]
[im 1/35]
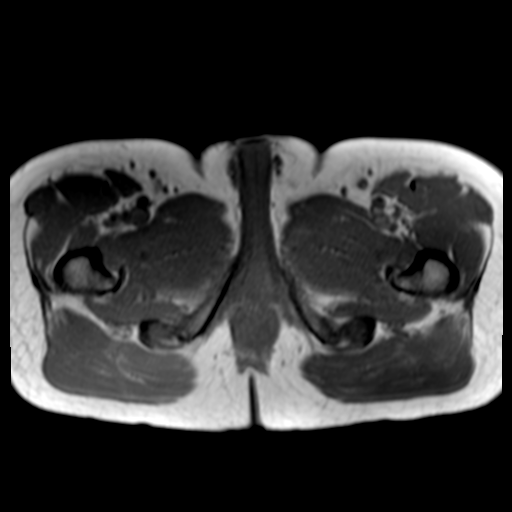

[Series 3: ax in&out whole · axial · 6.0mm · 0.74mm/px · 1 of 35 slices shown (2 of 2)]
[im 1/35]
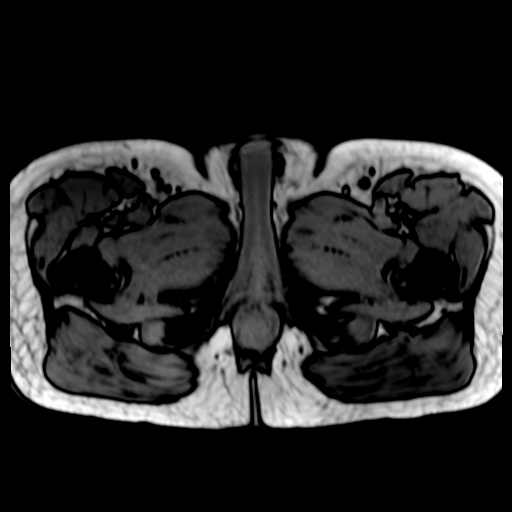

[Series 4: T2 · axial · 3.0mm · 0.56mm/px · 1 of 27 slices shown (1 of 3)]
[im 1/27]
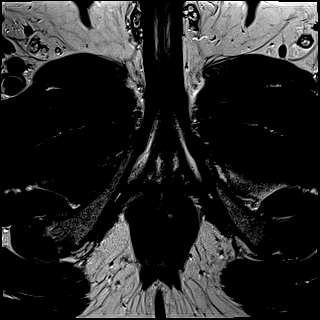

[Series 5: T2 · coronal · 3.0mm · 0.70mm/px · 1 of 35 slices shown (2 of 3)]
[im 1/35]
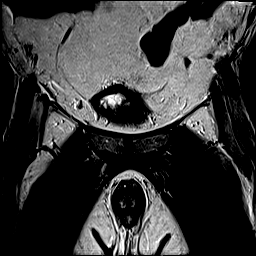

[Series 6: DWI · axial · 3.0mm · 0.86mm/px · 1 of 75 slices shown (1 of 3)]
[im 1/75]
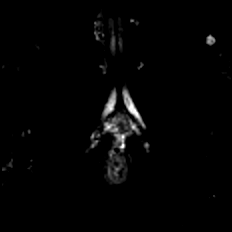

[Series 7: DWI · axial · 3.0mm · 0.86mm/px · 1 of 25 slices shown (2 of 3)]
[im 1/25]
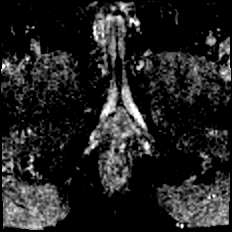

[Series 8: DWI · axial · 3.0mm · 0.86mm/px · 1 of 25 slices shown (3 of 3)]
[im 1/25]
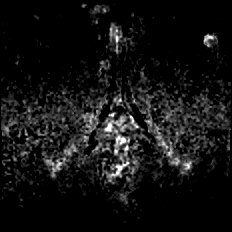

[Series 9: T2 · axial · 1.0mm · 1.04mm/px · 1 of 80 slices shown (3 of 3)]
[im 1/80]
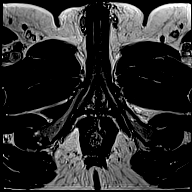

[Series 10: T1 · axial · 3.0mm · 1.15mm/px · 1 of 28 slices shown (1 of 48)]
[im 1/28]
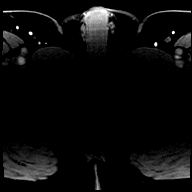

[Series 11: T1 · axial · 3.0mm · 1.15mm/px · 1 of 28 slices shown (2 of 48)]
[im 1/28]
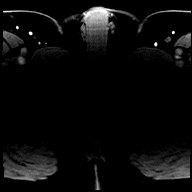

[Series 12: T1 · axial · 3.0mm · 1.15mm/px · 1 of 28 slices shown (3 of 48)]
[im 1/28]
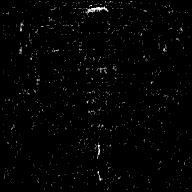

[Series 13: T1 · axial · 3.0mm · 1.15mm/px · 1 of 28 slices shown (4 of 48)]
[im 1/28]
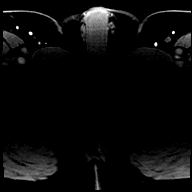

[Series 14: T1 · axial · 3.0mm · 1.15mm/px · 1 of 28 slices shown (5 of 48)]
[im 1/28]
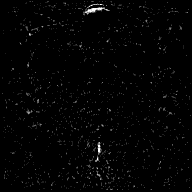

[Series 15: T1 · axial · 3.0mm · 1.15mm/px · 1 of 28 slices shown (6 of 48)]
[im 1/28]
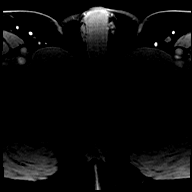

[Series 16: T1 · axial · 3.0mm · 1.15mm/px · 1 of 28 slices shown (7 of 48)]
[im 1/28]
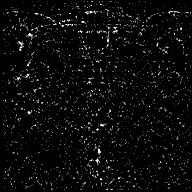

[Series 17: T1 · axial · 3.0mm · 1.15mm/px · 1 of 28 slices shown (8 of 48)]
[im 1/28]
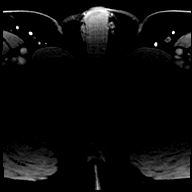

[Series 18: T1 · axial · 3.0mm · 1.15mm/px · 1 of 28 slices shown (9 of 48)]
[im 1/28]
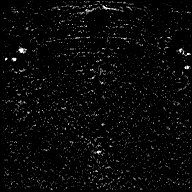

[Series 19: T1 · axial · 3.0mm · 1.15mm/px · 1 of 28 slices shown (10 of 48)]
[im 1/28]
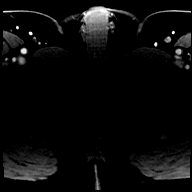

[Series 20: T1 · axial · 3.0mm · 1.15mm/px · 1 of 28 slices shown (11 of 48)]
[im 1/28]
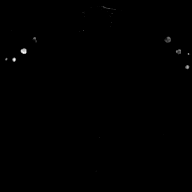

[Series 21: T1 · axial · 3.0mm · 1.15mm/px · 1 of 28 slices shown (12 of 48)]
[im 1/28]
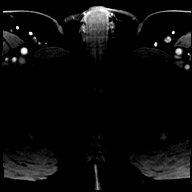

[Series 22: T1 · axial · 3.0mm · 1.15mm/px · 1 of 28 slices shown (13 of 48)]
[im 1/28]
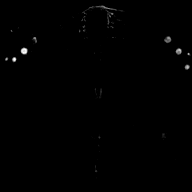

[Series 23: T1 · axial · 3.0mm · 1.15mm/px · 1 of 28 slices shown (14 of 48)]
[im 1/28]
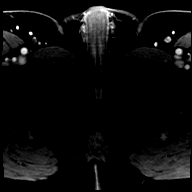

[Series 24: T1 · axial · 3.0mm · 1.15mm/px · 1 of 28 slices shown (15 of 48)]
[im 1/28]
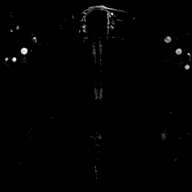

[Series 25: T1 · axial · 3.0mm · 1.15mm/px · 1 of 28 slices shown (16 of 48)]
[im 1/28]
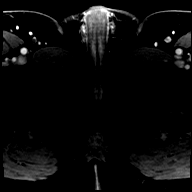

[Series 26: T1 · axial · 3.0mm · 1.15mm/px · 1 of 28 slices shown (17 of 48)]
[im 1/28]
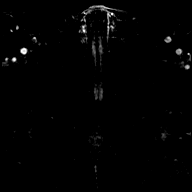

[Series 27: T1 · axial · 3.0mm · 1.15mm/px · 1 of 28 slices shown (18 of 48)]
[im 1/28]
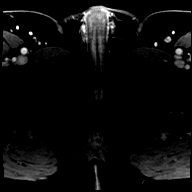

[Series 28: T1 · axial · 3.0mm · 1.15mm/px · 1 of 28 slices shown (19 of 48)]
[im 1/28]
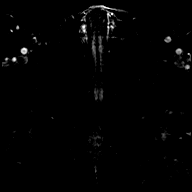

[Series 29: T1 · axial · 3.0mm · 1.15mm/px · 1 of 28 slices shown (20 of 48)]
[im 1/28]
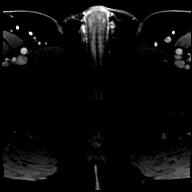

[Series 30: T1 · axial · 3.0mm · 1.15mm/px · 1 of 28 slices shown (21 of 48)]
[im 1/28]
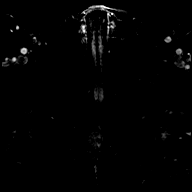

[Series 31: T1 · axial · 3.0mm · 1.15mm/px · 1 of 28 slices shown (22 of 48)]
[im 1/28]
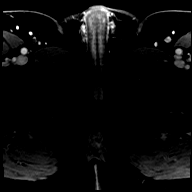

[Series 32: T1 · axial · 3.0mm · 1.15mm/px · 1 of 28 slices shown (23 of 48)]
[im 1/28]
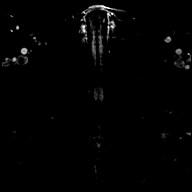

[Series 33: T1 · axial · 3.0mm · 1.15mm/px · 1 of 28 slices shown (24 of 48)]
[im 1/28]
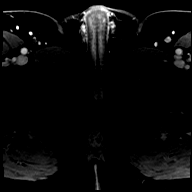

[Series 34: T1 · axial · 3.0mm · 1.15mm/px · 1 of 28 slices shown (25 of 48)]
[im 1/28]
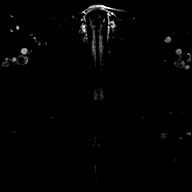

[Series 35: T1 · axial · 3.0mm · 1.15mm/px · 1 of 28 slices shown (26 of 48)]
[im 1/28]
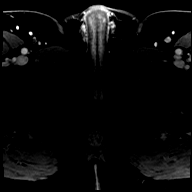

[Series 36: T1 · axial · 3.0mm · 1.15mm/px · 1 of 28 slices shown (27 of 48)]
[im 1/28]
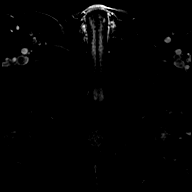

[Series 37: T1 · axial · 3.0mm · 1.15mm/px · 1 of 28 slices shown (28 of 48)]
[im 1/28]
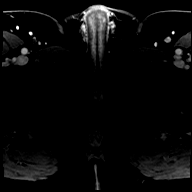

[Series 38: T1 · axial · 3.0mm · 1.15mm/px · 1 of 28 slices shown (29 of 48)]
[im 1/28]
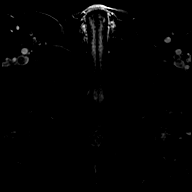

[Series 39: T1 · axial · 3.0mm · 1.15mm/px · 1 of 28 slices shown (30 of 48)]
[im 1/28]
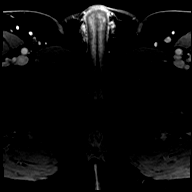

[Series 40: T1 · axial · 3.0mm · 1.15mm/px · 1 of 28 slices shown (31 of 48)]
[im 1/28]
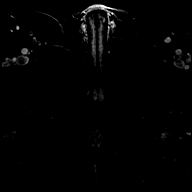

[Series 41: T1 · axial · 3.0mm · 1.15mm/px · 1 of 28 slices shown (32 of 48)]
[im 1/28]
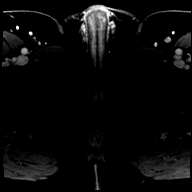

[Series 42: T1 · axial · 3.0mm · 1.15mm/px · 1 of 28 slices shown (33 of 48)]
[im 1/28]
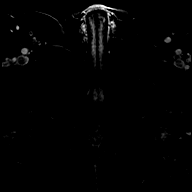

[Series 43: T1 · axial · 3.0mm · 1.15mm/px · 1 of 28 slices shown (34 of 48)]
[im 1/28]
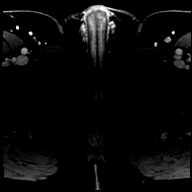

[Series 44: T1 · axial · 3.0mm · 1.15mm/px · 1 of 28 slices shown (35 of 48)]
[im 1/28]
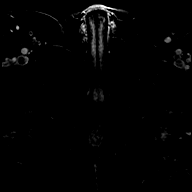

[Series 45: T1 · axial · 3.0mm · 1.15mm/px · 1 of 28 slices shown (36 of 48)]
[im 1/28]
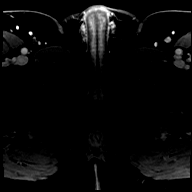

[Series 46: T1 · axial · 3.0mm · 1.15mm/px · 1 of 28 slices shown (37 of 48)]
[im 1/28]
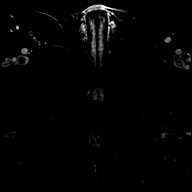

[Series 47: T1 · axial · 3.0mm · 1.15mm/px · 1 of 28 slices shown (38 of 48)]
[im 1/28]
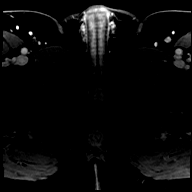

[Series 48: T1 · axial · 3.0mm · 1.15mm/px · 1 of 28 slices shown (39 of 48)]
[im 1/28]
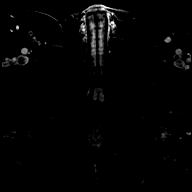

[Series 49: T1 · axial · 3.0mm · 1.15mm/px · 1 of 28 slices shown (40 of 48)]
[im 1/28]
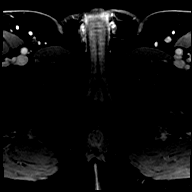

[Series 50: T1 · axial · 3.0mm · 1.15mm/px · 1 of 28 slices shown (41 of 48)]
[im 1/28]
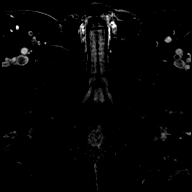

[Series 51: T1 · axial · 3.0mm · 1.15mm/px · 1 of 28 slices shown (42 of 48)]
[im 1/28]
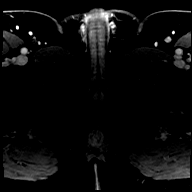

[Series 52: T1 · axial · 3.0mm · 1.15mm/px · 1 of 28 slices shown (43 of 48)]
[im 1/28]
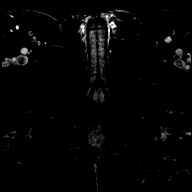

[Series 53: T1 · axial · 3.0mm · 1.15mm/px · 1 of 28 slices shown (44 of 48)]
[im 1/28]
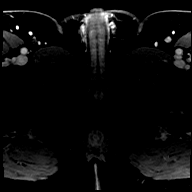

[Series 54: T1 · axial · 3.0mm · 1.15mm/px · 1 of 28 slices shown (45 of 48)]
[im 1/28]
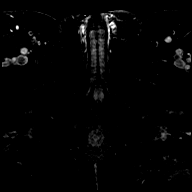

[Series 55: T1 · axial · 3.0mm · 1.15mm/px · 1 of 28 slices shown (46 of 48)]
[im 1/28]
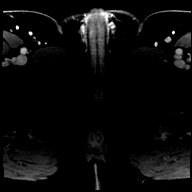

[Series 56: T1 · axial · 3.0mm · 1.15mm/px · 1 of 28 slices shown (47 of 48)]
[im 1/28]
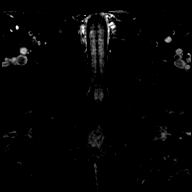

[Series 57: T1 · axial · 3.0mm · 1.15mm/px · 1 of 28 slices shown (48 of 48)]
[im 1/28]
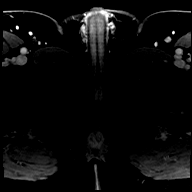

[56 of 56 positions shown; findings below may reference images not displayed]

FINDINGS: Prostate: Normal high signal intensity within the peripheral zone on
T2 weighted imaging. One band of low signal intensity is present in
the posterior RIGHT mid gland (image 60/2) which is favor scarring.

No clear foci of restricted diffusion within the peripheral zone
(series 6 and series 7).

No abnormal enhancement.

The transitional zone is enlarged by capsulated nodules. One nodule
does protrude from the posterior RIGHT aspect of the RIGHT base
measuring 12 mm on image 12/series 4.

Prostatic capsule is intact. RIGHT Seminal vesicles is normal. The
LEFT Seminal vesicle is small.

Volume: 4.7 by 4.7 by 4.5 cm (volume = 52 cm^3)

Transcapsular spread:  Absent

Seminal vesicle involvement: Absent

Neurovascular bundle involvement: Absent

Pelvic adenopathy: Absent

Bone metastasis: Absent

Other findings: None
IMPRESSION: 1. No high-grade carcinoma identified in the peripheral zone.
2. Linear striations in the posterior RIGHT mid gland are favored
benign scarring ( PI-RADS: 2).
3. Nodular transitional zone most consistent with benign prostate
hypertrophy ( PI-RADS: 2)
4. One hyperplastic nodule protrudes from the posterior RIGHT base.

## 2021-04-19 ENCOUNTER — Other Ambulatory Visit: Payer: Self-pay

## 2021-04-19 ENCOUNTER — Other Ambulatory Visit
Admission: RE | Admit: 2021-04-19 | Discharge: 2021-04-19 | Disposition: A | Discharge status: Home / Self Care | Payer: Medicare PPO | Attending: Urology | Admitting: Urology

## 2021-04-19 DIAGNOSIS — N402 Nodular prostate without lower urinary tract symptoms: Secondary | ICD-10-CM | POA: Diagnosis present

## 2021-04-19 LAB — PSA: Prostatic Specific Antigen: 3.5 ng/mL (ref 0.00–4.00)

## 2021-12-16 ENCOUNTER — Other Ambulatory Visit: Payer: Self-pay

## 2021-12-16 DIAGNOSIS — N401 Enlarged prostate with lower urinary tract symptoms: Secondary | ICD-10-CM

## 2021-12-19 ENCOUNTER — Other Ambulatory Visit
Admission: RE | Admit: 2021-12-19 | Discharge: 2021-12-19 | Disposition: A | Discharge status: Home / Self Care | Payer: Medicare PPO | Attending: Urology | Admitting: Urology

## 2021-12-19 ENCOUNTER — Ambulatory Visit: Payer: Medicare PPO | Admitting: Urology

## 2021-12-19 ENCOUNTER — Encounter: Payer: Self-pay | Admitting: Urology

## 2021-12-19 VITALS — BP 144/90 | HR 60 | Ht 68.0 in | Wt 200.0 lb

## 2021-12-19 DIAGNOSIS — N401 Enlarged prostate with lower urinary tract symptoms: Secondary | ICD-10-CM

## 2021-12-19 DIAGNOSIS — N529 Male erectile dysfunction, unspecified: Secondary | ICD-10-CM | POA: Diagnosis not present

## 2021-12-19 DIAGNOSIS — N138 Other obstructive and reflux uropathy: Secondary | ICD-10-CM | POA: Diagnosis present

## 2021-12-19 DIAGNOSIS — I861 Scrotal varices: Secondary | ICD-10-CM | POA: Diagnosis not present

## 2021-12-19 DIAGNOSIS — N503 Cyst of epididymis: Secondary | ICD-10-CM | POA: Diagnosis not present

## 2021-12-19 LAB — PSA: Prostatic Specific Antigen: 4.94 ng/mL — ABNORMAL HIGH (ref 0.00–4.00)

## 2021-12-19 MED ORDER — TADALAFIL 5 MG PO TABS: 5.0000 mg | ORAL_TABLET | Freq: Every day | ORAL | 3 refills | Status: DC | PRN

## 2021-12-19 MED ORDER — TAMSULOSIN HCL 0.4 MG PO CAPS: 0.4000 mg | ORAL_CAPSULE | Freq: Every day | ORAL | 3 refills | Status: DC

## 2021-12-20 ENCOUNTER — Telehealth: Payer: Self-pay

## 2022-01-02 ENCOUNTER — Ambulatory Visit: Payer: Medicare PPO

## 2022-01-03 ENCOUNTER — Other Ambulatory Visit: Payer: Medicare PPO

## 2022-01-05 ENCOUNTER — Ambulatory Visit
Admission: RE | Admit: 2022-01-05 | Discharge: 2022-01-05 | Disposition: A | Discharge status: Home / Self Care | Payer: Medicare PPO | Source: Ambulatory Visit | Attending: Urology | Admitting: Urology

## 2022-01-05 DIAGNOSIS — N503 Cyst of epididymis: Secondary | ICD-10-CM | POA: Insufficient documentation

## 2022-01-05 DIAGNOSIS — I861 Scrotal varices: Secondary | ICD-10-CM | POA: Diagnosis not present

## 2022-01-09 ENCOUNTER — Telehealth: Payer: Self-pay

## 2022-01-09 NOTE — Telephone Encounter (Signed)
Pt notified via Mychart (see separate encounter).

## 2022-01-09 NOTE — Telephone Encounter (Signed)
-----   Message from Nori Riis, PA-C sent at 01/08/2022  1:03 PM EDT ----- Please let Craig [name] know that his scrotal ultrasound did not have any concerning findings, so I recommend moving forward with the prostate MRI.

## 2022-02-04 ENCOUNTER — Ambulatory Visit (INDEPENDENT_AMBULATORY_CARE_PROVIDER_SITE_OTHER): Payer: Medicare PPO

## 2022-02-04 ENCOUNTER — Ambulatory Visit
Admission: EM | Admit: 2022-02-04 | Discharge: 2022-02-04 | Disposition: A | Discharge status: Home / Self Care | Payer: Medicare PPO | Attending: Family Medicine | Admitting: Family Medicine

## 2022-02-04 DIAGNOSIS — J22 Unspecified acute lower respiratory infection: Secondary | ICD-10-CM | POA: Diagnosis not present

## 2022-02-04 DIAGNOSIS — R059 Cough, unspecified: Secondary | ICD-10-CM

## 2022-02-04 DIAGNOSIS — Z20822 Contact with and (suspected) exposure to covid-19: Secondary | ICD-10-CM | POA: Diagnosis not present

## 2022-02-04 LAB — SARS CORONAVIRUS 2 BY RT PCR: SARS Coronavirus 2 by RT PCR: NEGATIVE

## 2022-02-04 MED ORDER — ALBUTEROL SULFATE HFA 108 (90 BASE) MCG/ACT IN AERS: 2.0000 | INHALATION_SPRAY | RESPIRATORY_TRACT | 0 refills | Status: DC | PRN

## 2022-02-04 MED ORDER — BENZONATATE 100 MG PO CAPS: 100.0000 mg | ORAL_CAPSULE | Freq: Three times a day (TID) | ORAL | 0 refills | Status: DC

## 2022-02-04 MED ORDER — AZITHROMYCIN 250 MG PO TABS: ORAL_TABLET | ORAL | 0 refills | Status: AC

## 2022-02-04 NOTE — Discharge Instructions (Signed)
Stop by the pharmacy to pick up your prescriptions.  Take the first dose (2 tablets ) of antibiotics today and then 1 tablet daily after that.  Use the albuterol inhaler every 4-6 hours or whenever you have a prolonged coughing episode (bronchospasm) like discussed.

## 2022-02-04 NOTE — ED Triage Notes (Signed)
Pt c/o cough onset Friday of last week, does not stop when lying down. Dry & productive cough

## 2022-02-04 NOTE — ED Provider Notes (Signed)
[facility]    CSN: [id number] Arrival date & time: 02/04/22  1034      History   Chief Complaint Chief Complaint  Patient presents with   Cough    HPI Craig [name] is a 71 y.o. male.   HPI  Craig [name] presents for cough that has been present for over a week.  States that his daughter and his friend have similar symptoms.  At nighttime his cough is worse and he cannot get any sleep.  His daughter was given antibiotics that helped her.  He has felt feverish but has not documented his temperature.  At times he coughs things up.  He has taken Robitussin without relief.  Denies chest discomfort and burning in his chest.  No recent history of acid reflux.  He is a former smoker.    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   Gastroesophageal reflux disease without esophagitis 11/15/2016   BPH with obstruction/lower urinary tract symptoms 05/08/2015   Erectile dysfunction 05/08/2015    Past Surgical History:  Procedure Laterality Date   ANTERIOR RELEASE VERTEBRAL BODY W/ POSTERIOR FUSION     APPENDECTOMY     CATARACT EXTRACTION W/PHACO Right 08/21/2017   Procedure: CATARACT EXTRACTION PHACO AND INTRAOCULAR LENS PLACEMENT ([place]) RIGHT;  Surgeon: Craig [name] Bear, MD;  Location: [place];  Service: Ophthalmology;  Laterality: Right;   CATARACT EXTRACTION  W/PHACO Left 09/18/2017   Procedure: CATARACT EXTRACTION PHACO AND INTRAOCULAR LENS PLACEMENT ([place]) LEFT;  Surgeon: Craig [name] Bear, MD;  Location: [facility];  Service: Ophthalmology;  Laterality: Left;   COLONOSCOPY WITH PROPOFOL N/A 12/06/2017   Procedure: COLONOSCOPY WITH PROPOFOL;  Surgeon: Lucilla Lame, MD;  Location: [place];  Service: Endoscopy;  Laterality: N/A;   HERNIA REPAIR  1975/1960   LEG SURGERY Right 2012   NECK SURGERY     POLYPECTOMY N/A 12/06/2017   Procedure: POLYPECTOMY;  Surgeon: Lucilla Lame, MD;  Location: [facility];  Service: Endoscopy;  Laterality: N/A;   ruptured disc     VARICOCELECTOMY         Home Medications    Prior to Admission medications   Medication Sig Start Date End Date Taking? Authorizing Provider  albuterol (VENTOLIN HFA) 108 (90 Base) MCG/ACT inhaler Inhale 2 puffs into the lungs every 4 (four) hours as needed for wheezing or shortness of breath. 02/04/22  Yes Shiv Shuey, DO  azithromycin (ZITHROMAX Z-PAK) 250 MG tablet Take 2 tablets (500 mg total) by mouth daily for 1 day, THEN 1 tablet (250 mg total) daily for 4 days. 02/04/22 02/09/22 Yes Hope Holst, DO  benzonatate (TESSALON) 100 MG capsule Take 1 capsule (100 mg total) by mouth every 8 (eight) hours. 02/04/22  Yes Joe Tanney, DO  ibuprofen (ADVIL,MOTRIN) 200 MG tablet Take  200 mg by mouth 2 (two) times daily. 3 in am 2 at night   Yes [provider]  omeprazole (PRILOSEC) 40 MG capsule Take 20 mg by mouth daily. am   Yes [provider]  tadalafil (CIALIS) 5 MG tablet Take 1 tablet (5 mg total) by mouth daily as needed for erectile dysfunction. 12/19/21  Yes McGowan, Larene Beach A, PA-C  tamsulosin (FLOMAX) 0.4 MG CAPS capsule Take 1 capsule (0.4 mg total) by mouth daily. 12/19/21  Yes McGowan, Larene Beach A, PA-C  UNABLE TO FIND Take 1 capsule by mouth 2 (two) times daily. Med Name: Super Beta Prostate   Yes [provider]     Family History Family History  Problem Relation Age of Onset   Kidney cancer Neg Hx    Prostate cancer Neg Hx    Kidney disease Neg Hx     Social History Social History   Tobacco Use   Smoking status: Former    Types: Cigarettes   Smokeless tobacco: Never   Tobacco comments:    quit 40 years  Substance Use Topics   Alcohol use: Yes    Comment: occasional   Drug use: No     Allergies   Ivp dye [iodinated contrast media]   Review of Systems Review of Systems: :negative unless otherwise stated in HPI.      Physical Exam Triage Vital Signs ED Triage Vitals  Enc Vitals Group     BP      Pulse      Resp      Temp      Temp src      SpO2      Weight      Height      Head Circumference      Peak Flow      Pain Score      Pain Loc      Pain Edu?      Excl. in [place]?    No data found.  Updated Vital Signs BP (!) 157/90 (BP Location: Left Arm)   Pulse 72   Temp 98.6 F (37 C) (Oral)   Ht '5\' 8"'$  (1.727 m)   Wt 90.7 kg   SpO2 96%   BMI 30.41 kg/m   Visual Acuity Right Eye Distance:   Left Eye Distance:   Bilateral Distance:    Right Eye Near:   Left Eye Near:    Bilateral Near:     Physical Exam GEN:     alert, non-ill appearing male in no distress    EYES:   pupils equal and reactive, no scleral injection NECK:  normal ROM RESP:  clear to auscultation bilaterally, no increased work of breathing, no wheezes, rales or rhonchi CVS:   regular rate and rhythm Skin:   warm and dry    UC Treatments / Results  Labs (all labs ordered are listed, but only abnormal results are displayed) Labs Reviewed  SARS CORONAVIRUS 2 BY RT PCR    EKG   Radiology DG Chest 2 View  Result Date: 02/04/2022 CLINICAL DATA:  Cough beginning 1 week ago. EXAM: CHEST - 2 VIEW COMPARISON:  None Available. FINDINGS: The heart size and mediastinal contours are within normal limits. Both lungs are clear. The visualized skeletal structures are unremarkable. IMPRESSION:  No active cardiopulmonary disease. Electronically Signed   By: San Morelle M.D.   On: 02/04/2022 11:42    Procedures Procedures (including critical care time)  Medications Ordered in UC Medications -  No data to display  Initial Impression / Assessment and Plan / UC Course  I have reviewed the triage vital signs and the nursing notes.  Pertinent labs & imaging results that were available during my care of the patient were reviewed by me and considered in my medical decision making (see chart for details).       Antibiotics per medication orders. Antitussives per medication orders. Avoid exposure to tobacco smoke and fumes. B-agonist inhaler. Call if shortness of breath worsens, blood in sputum, change in character of cough, development of fever or chills, inability to maintain nutrition and hydration. Avoid exposure to tobacco smoke and fumes. Chest x-ray.  Patient is a 71 year old male who presents for about 2 weeks of cough that is productive.  Chest x-ray showing no focal bacterial pneumonia or edema.  Treat patient for community-acquired pneumonia. Overall pt is well appearing, well hydrated, without respiratory distress.  COVID test obtained and was negative.   He is taking his omeprazole therefore I do not think his cough is GERD related. Discussed ED precautions, understanding voiced       Final Clinical Impressions(s) / UC Diagnoses   Final diagnoses:  Lower respiratory infection     Discharge Instructions      Stop by the pharmacy to pick up your prescriptions.  Take the first dose (2 tablets ) of antibiotics today and then 1 tablet daily after that.  Use the albuterol inhaler every 4-6 hours or whenever you have a prolonged coughing episode (bronchospasm) like discussed.     ED Prescriptions     Medication Sig Dispense Auth. Provider   albuterol (VENTOLIN HFA) 108 (90 Base) MCG/ACT inhaler Inhale 2 puffs into the lungs every 4 (four) hours as needed  for wheezing or shortness of breath. 6.7 each Uvaldo Rybacki, DO   benzonatate (TESSALON) 100 MG capsule Take 1 capsule (100 mg total) by mouth every 8 (eight) hours. 21 capsule Martine Bleecker, DO   azithromycin (ZITHROMAX Z-PAK) 250 MG tablet Take 2 tablets (500 mg total) by mouth daily for 1 day, THEN 1 tablet (250 mg total) daily for 4 days. 6 tablet Lyndee Hensen, DO      PDMP not reviewed this encounter.   Lyndee Hensen, DO 02/04/22 1201

## 2022-03-09 DIAGNOSIS — L298 Other pruritus: Secondary | ICD-10-CM | POA: Diagnosis not present

## 2022-03-09 DIAGNOSIS — L821 Other seborrheic keratosis: Secondary | ICD-10-CM | POA: Diagnosis not present

## 2022-03-09 DIAGNOSIS — L578 Other skin changes due to chronic exposure to nonionizing radiation: Secondary | ICD-10-CM | POA: Diagnosis not present

## 2022-03-09 DIAGNOSIS — L570 Actinic keratosis: Secondary | ICD-10-CM | POA: Diagnosis not present

## 2022-03-09 DIAGNOSIS — Z872 Personal history of diseases of the skin and subcutaneous tissue: Secondary | ICD-10-CM | POA: Diagnosis not present

## 2022-05-01 DIAGNOSIS — H43813 Vitreous degeneration, bilateral: Secondary | ICD-10-CM | POA: Diagnosis not present

## 2022-05-01 DIAGNOSIS — Z961 Presence of intraocular lens: Secondary | ICD-10-CM | POA: Diagnosis not present

## 2022-05-25 ENCOUNTER — Other Ambulatory Visit: Payer: Self-pay

## 2022-05-25 DIAGNOSIS — N138 Other obstructive and reflux uropathy: Secondary | ICD-10-CM

## 2022-05-29 ENCOUNTER — Other Ambulatory Visit: Payer: Self-pay

## 2022-05-29 ENCOUNTER — Other Ambulatory Visit: Payer: Medicare PPO

## 2022-05-29 ENCOUNTER — Other Ambulatory Visit
Admission: RE | Admit: 2022-05-29 | Discharge: 2022-05-29 | Disposition: A | Discharge status: Home / Self Care | Payer: Medicare PPO | Attending: Urology | Admitting: Urology

## 2022-05-29 DIAGNOSIS — N401 Enlarged prostate with lower urinary tract symptoms: Secondary | ICD-10-CM | POA: Diagnosis not present

## 2022-05-29 DIAGNOSIS — N138 Other obstructive and reflux uropathy: Secondary | ICD-10-CM | POA: Diagnosis not present

## 2022-05-30 ENCOUNTER — Other Ambulatory Visit: Payer: Self-pay | Admitting: Urology

## 2022-05-30 DIAGNOSIS — N402 Nodular prostate without lower urinary tract symptoms: Secondary | ICD-10-CM

## 2022-05-30 DIAGNOSIS — R972 Elevated prostate specific antigen [PSA]: Secondary | ICD-10-CM

## 2022-05-30 LAB — PSA, TOTAL AND FREE
PSA, Free Pct: 21.8 %
PSA, Free: 1.33 ng/mL
Prostate Specific Ag, Serum: 6.1 ng/mL — ABNORMAL HIGH (ref 0.0–4.0)

## 2022-06-13 ENCOUNTER — Ambulatory Visit
Admission: RE | Admit: 2022-06-13 | Discharge: 2022-06-13 | Disposition: A | Discharge status: Home / Self Care | Payer: Medicare PPO | Source: Ambulatory Visit | Attending: Urology | Admitting: Urology

## 2022-06-13 DIAGNOSIS — N402 Nodular prostate without lower urinary tract symptoms: Secondary | ICD-10-CM | POA: Insufficient documentation

## 2022-06-13 DIAGNOSIS — R972 Elevated prostate specific antigen [PSA]: Secondary | ICD-10-CM | POA: Diagnosis not present

## 2022-06-13 MED ORDER — GADOBUTROL 1 MMOL/ML IV SOLN
7.5000 mL | Freq: Once | INTRAVENOUS | Status: AC | PRN
  Administered 2022-06-13: 7.5 mL via INTRAVENOUS

## 2022-06-21 ENCOUNTER — Telehealth: Payer: Self-pay | Admitting: Family Medicine

## 2022-06-21 NOTE — Telephone Encounter (Signed)
Patient notified and voiced understanding.

## 2022-06-21 NOTE — Telephone Encounter (Signed)
-----   Message from Nori Riis, PA-C sent at 06/16/2022  7:42 PM EST ----- Please let Mr. Deshmukh know that his prostate MRI did not show any areas that would be concerning for prostate cancer.  Based on this information, we do not need to proceed with a biopsy.  I do recommend to continue with close follow up and encourage him to keep his appointment in July as prostate MRI can miss prostate cancer 20% of the time.

## 2022-11-28 DIAGNOSIS — N1831 Chronic kidney disease, stage 3a: Secondary | ICD-10-CM | POA: Diagnosis not present

## 2022-11-28 DIAGNOSIS — Z1331 Encounter for screening for depression: Secondary | ICD-10-CM | POA: Diagnosis not present

## 2022-11-28 DIAGNOSIS — Z0000 Encounter for general adult medical examination without abnormal findings: Secondary | ICD-10-CM | POA: Diagnosis not present

## 2022-11-28 DIAGNOSIS — N400 Enlarged prostate without lower urinary tract symptoms: Secondary | ICD-10-CM | POA: Diagnosis not present

## 2022-11-28 DIAGNOSIS — K219 Gastro-esophageal reflux disease without esophagitis: Secondary | ICD-10-CM | POA: Diagnosis not present

## 2022-11-28 DIAGNOSIS — M129 Arthropathy, unspecified: Secondary | ICD-10-CM | POA: Diagnosis not present

## 2022-11-28 DIAGNOSIS — M25562 Pain in left knee: Secondary | ICD-10-CM | POA: Diagnosis not present

## 2022-12-18 ENCOUNTER — Ambulatory Visit: Payer: Medicare PPO | Admitting: Urology

## 2022-12-19 ENCOUNTER — Other Ambulatory Visit: Payer: Self-pay | Admitting: Family Medicine

## 2022-12-19 DIAGNOSIS — N401 Enlarged prostate with lower urinary tract symptoms: Secondary | ICD-10-CM

## 2022-12-19 MED ORDER — TAMSULOSIN HCL 0.4 MG PO CAPS: 0.4000 mg | ORAL_CAPSULE | Freq: Every day | ORAL | 3 refills | Status: DC

## 2022-12-22 ENCOUNTER — Other Ambulatory Visit: Payer: Self-pay

## 2022-12-22 DIAGNOSIS — R972 Elevated prostate specific antigen [PSA]: Secondary | ICD-10-CM

## 2022-12-22 DIAGNOSIS — N402 Nodular prostate without lower urinary tract symptoms: Secondary | ICD-10-CM

## 2022-12-24 NOTE — Progress Notes (Unsigned)
12/25/22 10:12 AM   Craig [name] 1951/04/03 [id number]  Referring provider:  Marina Goodell, MD [address] [place],  [place] [address]   Urological history  1. ED -contributing factors of age, BPH and history of smoking  -managed with tadalafil 5 mg daily    2. BPH with LU TS -prostate volume 58 cc on 2023 prostate MRI -managed with tadalafil 5 mg daily and tamsulosin 0.4 mg every three days    3. Prostate nodule/Elevated PSA -PSA pending -nodule found incidentally on exam 1 cm nodule is appreciated in the right lobe -Prostate MRI completed 06/09/2020 - no high-grade carcinoma identified in the peripheral zone.  Linear striations in the posterior RIGHT mid gland are favored benign scarring ( PI-RADS: 2).  Nodular transitional zone most consistent with benign prostate hypertrophy ( PI-RADS: 2).  One hyperplastic nodule protrudes from the posterior RIGHT base.    -Prostate MRI completed 06/13/2022 - No significant change from comparison prostate MRI.  No high-grade carcinoma identified in the peripheral zone.  Linear striations in the peripheral zone likely related to prior prostatitis. PI-RADS: 2  Enlarged nodular transitional zone most consistent with benign prostate hypertrophy. PI-RADS: 2   4. Family history of prostate cancer -father with non-fatal prostate cancer died at the age of 85    5. Epididymal cyst -scrotal ultrasound 2023 - small 8 mm right-sided epididymal tail cyst  6. Varicocele -scrotal ultrasound 2023 - stable left varicocele  7. Atrophic left testicle -scrotal ultrasound 2023 - Stable heterogeneous slightly atrophic left testicle with internal calcifications, consistent with prior traumatic insult or inflammatory/infectious process.   6. Nephrolithiasis -spontaneous passage in 2008  Chief Complaint  Patient presents with   Erectile Dysfunction   Benign Prostatic Hypertrophy    HPI: Craig [name] is a 72 y.o.male who returns for an annual  follow-up,  I PSS 25/3  He has frequency, urgency, urge incontinence and a weak urinary stream that continues to worsen since the last visit.  He is taking tadalafil 5 mg daily.  He takes tamsulosin for about 3 to 4 days and then will abstain for 2 days.  This is because it has the untoward side effect of ejaculatory disorder which is bothersome to him.  Patient denies any modifying or aggravating factors.  Patient denies any recent UTI's, gross hematuria, dysuria or suprapubic/flank pain.  Patient denies any fevers, chills, nausea or vomiting.    IPSS     Row Name 12/25/22 0900         International Prostate Symptom Score   How often have you had the sensation of not emptying your bladder? More than half the time     How often have you had to urinate less than every two hours? About half the time     How often have you found you stopped and started again several times when you urinated? Almost always     How often have you found it difficult to postpone urination? Almost always     How often have you had a weak urinary stream? Almost always     How often have you had to strain to start urination? Less than 1 in 5 times     How many times did you typically get up at night to urinate? 2 Times     Total IPSS Score 25       Quality of Life due to urinary symptoms   If you were to spend the rest of your life  with your urinary condition just the way it is now how would you feel about that? Mixed               Score:  1-7 Mild 8-19 Moderate 20-35 Severe  SHIM 21  Patient still having spontaneous erections.  He denies any pain or curvature with erections.  He has good response to tadalafil 5 mg daily.   SHIM     Row Name 12/25/22 [phone]         SHIM: Over the last 6 months:   How do you rate your confidence that you could get and keep an erection? Moderate     When you had erections with sexual stimulation, how often were your erections hard enough for penetration (entering  your partner)? Most Times (much more than half the time)     During sexual intercourse, how often were you able to maintain your erection after you had penetrated (entered) your partner? Almost Always or Always     During sexual intercourse, how difficult was it to maintain your erection to completion of intercourse? Not Difficult     When you attempted sexual intercourse, how often was it satisfactory for you? 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   Vitamin D deficiency     Surgical History: Past Surgical History:  Procedure Laterality Date   ANTERIOR RELEASE VERTEBRAL BODY W/ POSTERIOR FUSION     APPENDECTOMY     CATARACT EXTRACTION W/PHACO Right 08/21/2017   Procedure: CATARACT EXTRACTION PHACO AND INTRAOCULAR LENS PLACEMENT (IOC) RIGHT;  Surgeon: Nevada Crane, MD;  Location: [facility];  Service: Ophthalmology;  Laterality: Right;   CATARACT EXTRACTION W/PHACO Left 09/18/2017   Procedure: CATARACT EXTRACTION PHACO AND INTRAOCULAR LENS PLACEMENT (IOC) LEFT;  Surgeon: Nevada Crane, MD;  Location: [facility];  Service: Ophthalmology;  Laterality: Left;   COLONOSCOPY WITH PROPOFOL N/A 12/06/2017   Procedure: COLONOSCOPY WITH PROPOFOL;  Surgeon: Midge Minium, MD;  Location: [facility];  Service: Endoscopy;  Laterality: N/A;   HERNIA REPAIR  1975/1960   LEG SURGERY Right 2012   NECK SURGERY     POLYPECTOMY N/A 12/06/2017   Procedure: POLYPECTOMY;  Surgeon: Midge Minium, MD;  Location: [facility];  Service: Endoscopy;  Laterality: N/A;    ruptured disc     VARICOCELECTOMY      Home Medications:  Allergies as of 12/25/2022       Reactions   Ivp Dye [iodinated Contrast Media] Anaphylaxis        Medication List        Accurate as of December 25, 2022 10:12 AM. If you have any questions, ask your nurse or doctor.          STOP taking these medications    albuterol 108 (90 Base) MCG/ACT inhaler Commonly known as: VENTOLIN HFA Stopped by: Adelia Baptista, PA-C   benzonatate 100 MG capsule Commonly known as: TESSALON Stopped by: Carollee Herter  Juris Gosnell, PA-C   UNABLE TO FIND Stopped by: Michiel Cowboy, PA-C       TAKE these medications    ibuprofen 200 MG tablet Commonly known as: ADVIL Take 200 mg by mouth 2 (two) times daily. 3 in am 2 at night   omeprazole 20 MG capsule Commonly known as: PRILOSEC Take 20 mg by mouth daily. What changed: Another medication with the same name was removed. Continue taking this medication, and follow the directions you see here. Changed by: Michiel Cowboy, PA-C   tadalafil 5 MG tablet Commonly known as: CIALIS Take 1 tablet (5 mg total) by mouth daily as needed for erectile dysfunction.   tamsulosin 0.4 MG Caps capsule Commonly known as: FLOMAX Take 1 capsule (0.4 mg total) by mouth daily.        Allergies:  Allergies  Allergen Reactions   Ivp Dye [Iodinated Contrast Media] Anaphylaxis    Family History: Family History  Problem Relation Age of Onset   Kidney cancer Neg Hx    Prostate cancer Neg Hx    Kidney disease Neg Hx     Social History:  reports that he has quit smoking. His smoking use included cigarettes. He has never used smokeless tobacco. He reports current alcohol use. He reports that he does not use drugs.   Physical Exam: BP (!) 143/75 (BP Location: Left Arm, Patient Position: Sitting, Cuff Size: Large)   Pulse (!) 51   Ht 5\' 8"  (1.727 m)   Wt 200 lb (90.7 kg)   BMI 30.41 kg/m   Constitutional:  Well nourished. Alert and oriented, No  acute distress. HEENT: [place] AT, moist mucus membranes.  Trachea midline Cardiovascular: No clubbing, cyanosis, or edema. Respiratory: Normal respiratory effort, no increased work of breathing. GU: No CVA tenderness.  No bladder fullness or masses.  Patient with uncircumcised phallus.  Foreskin easily retracted.  Urethral meatus is patent.  No penile discharge. No penile lesions or rashes. Scrotum without lesions, cysts, rashes and/or edema.  Testicles are located scrotally bilaterally.  Left testicle atrophic.  Left varicoceles noted.  No masses are appreciated in the testicles. Left epididymis are normal.  Right 8 mm epididymal cyst is noted. Rectal: Patient with  normal sphincter tone. Anus and perineum without scarring or rashes. No rectal masses are appreciated. Prostate is approximately 45 + grams, could only palpate the apex and the midportion of the gland, no nodules are appreciated. Seminal vesicles could not be palpated.  Neurologic: Grossly intact, no focal deficits, moving all 4 extremities. Psychiatric: Normal mood and affect.   Laboratory Data: Serum creatinine (11/28/2022) 1.2, eGFR 64 Total cholesterol (11/28/2022) 168 TSH (11/28/2022) 3.951 I have reviewed the labs.   Pertinent Imaging N/A  Assessment & Plan:    Prostate nodule  -PSA pending -Not palpated today's exam  2. 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 Right epididymal cyst  - stable  5. Left varicocele  - stable   Return in about 1 year (around 12/25/2023) for IPSS, SHIM, PSA and  exam.  Michiel Cowboy, PA-C   [facility] [address] [place], [place] [address] [phone]

## 2022-12-25 ENCOUNTER — Other Ambulatory Visit
Admission: RE | Admit: 2022-12-25 | Discharge: 2022-12-25 | Disposition: A | Discharge status: Home / Self Care | Payer: Medicare PPO | Attending: Urology | Admitting: Urology

## 2022-12-25 ENCOUNTER — Other Ambulatory Visit: Payer: Self-pay | Admitting: Urology

## 2022-12-25 ENCOUNTER — Encounter: Payer: Self-pay | Admitting: Urology

## 2022-12-25 ENCOUNTER — Ambulatory Visit: Payer: Medicare PPO | Admitting: Urology

## 2022-12-25 VITALS — BP 143/75 | HR 51 | Ht 68.0 in | Wt 200.0 lb

## 2022-12-25 DIAGNOSIS — N401 Enlarged prostate with lower urinary tract symptoms: Secondary | ICD-10-CM

## 2022-12-25 DIAGNOSIS — N402 Nodular prostate without lower urinary tract symptoms: Secondary | ICD-10-CM | POA: Insufficient documentation

## 2022-12-25 DIAGNOSIS — I861 Scrotal varices: Secondary | ICD-10-CM | POA: Diagnosis not present

## 2022-12-25 DIAGNOSIS — R972 Elevated prostate specific antigen [PSA]: Secondary | ICD-10-CM

## 2022-12-25 DIAGNOSIS — N529 Male erectile dysfunction, unspecified: Secondary | ICD-10-CM | POA: Diagnosis not present

## 2022-12-25 DIAGNOSIS — R3915 Urgency of urination: Secondary | ICD-10-CM | POA: Insufficient documentation

## 2022-12-25 DIAGNOSIS — N138 Other obstructive and reflux uropathy: Secondary | ICD-10-CM | POA: Diagnosis not present

## 2022-12-25 DIAGNOSIS — N503 Cyst of epididymis: Secondary | ICD-10-CM | POA: Diagnosis not present

## 2022-12-25 LAB — URINALYSIS, COMPLETE (UACMP) WITH MICROSCOPIC
Bilirubin Urine: NEGATIVE
Glucose, UA: NEGATIVE mg/dL
Ketones, ur: NEGATIVE mg/dL
Nitrite: NEGATIVE
Protein, ur: NEGATIVE mg/dL
Specific Gravity, Urine: >1.03 — ABNORMAL HIGH (ref 1.005–1.030)
pH: 5.5 (ref 5.0–8.0)

## 2022-12-25 LAB — PSA: Prostatic Specific Antigen: 9.62 ng/mL — ABNORMAL HIGH (ref 0.00–4.00)

## 2022-12-25 MED ORDER — TADALAFIL 5 MG PO TABS: 5.0000 mg | ORAL_TABLET | Freq: Every day | ORAL | 3 refills | Status: DC | PRN

## 2022-12-27 ENCOUNTER — Other Ambulatory Visit: Payer: Self-pay | Admitting: Urology

## 2022-12-27 DIAGNOSIS — N390 Urinary tract infection, site not specified: Secondary | ICD-10-CM

## 2022-12-27 DIAGNOSIS — R972 Elevated prostate specific antigen [PSA]: Secondary | ICD-10-CM

## 2022-12-27 LAB — URINE CULTURE: Culture: 40000 — AB

## 2022-12-27 MED ORDER — NITROFURANTOIN MONOHYD MACRO 100 MG PO CAPS: 100.0000 mg | ORAL_CAPSULE | Freq: Two times a day (BID) | ORAL | 0 refills | Status: DC

## 2023-01-31 ENCOUNTER — Other Ambulatory Visit
Admission: RE | Admit: 2023-01-31 | Discharge: 2023-01-31 | Disposition: A | Discharge status: Home / Self Care | Payer: Medicare PPO | Attending: Urology | Admitting: Urology

## 2023-01-31 DIAGNOSIS — N390 Urinary tract infection, site not specified: Secondary | ICD-10-CM | POA: Diagnosis not present

## 2023-01-31 DIAGNOSIS — R972 Elevated prostate specific antigen [PSA]: Secondary | ICD-10-CM | POA: Diagnosis not present

## 2023-01-31 LAB — URINALYSIS, COMPLETE (UACMP) WITH MICROSCOPIC
Bilirubin Urine: NEGATIVE
Glucose, UA: NEGATIVE mg/dL
Ketones, ur: NEGATIVE mg/dL
Nitrite: NEGATIVE
Protein, ur: NEGATIVE mg/dL
Specific Gravity, Urine: 1.025 (ref 1.005–1.030)
pH: 5.5 (ref 5.0–8.0)

## 2023-01-31 LAB — PSA: Prostatic Specific Antigen: 7.86 ng/mL — ABNORMAL HIGH (ref 0.00–4.00)

## 2023-02-02 ENCOUNTER — Other Ambulatory Visit: Payer: Self-pay | Admitting: Urology

## 2023-02-02 DIAGNOSIS — N390 Urinary tract infection, site not specified: Secondary | ICD-10-CM

## 2023-02-02 DIAGNOSIS — R972 Elevated prostate specific antigen [PSA]: Secondary | ICD-10-CM

## 2023-02-02 MED ORDER — AMPICILLIN 500 MG PO CAPS
500.0000 mg | ORAL_CAPSULE | Freq: Four times a day (QID) | ORAL | 0 refills | Status: DC
Start: 2023-02-02 — End: 2024-01-14

## 2023-02-02 MED ORDER — AMPICILLIN 500 MG PO CAPS
500.0000 mg | ORAL_CAPSULE | Freq: Four times a day (QID) | ORAL | 0 refills | Status: DC
Start: 2023-02-02 — End: 2023-02-02

## 2023-03-02 DIAGNOSIS — G629 Polyneuropathy, unspecified: Secondary | ICD-10-CM | POA: Diagnosis not present

## 2023-03-02 DIAGNOSIS — Z8249 Family history of ischemic heart disease and other diseases of the circulatory system: Secondary | ICD-10-CM | POA: Diagnosis not present

## 2023-03-02 DIAGNOSIS — R32 Unspecified urinary incontinence: Secondary | ICD-10-CM | POA: Diagnosis not present

## 2023-03-02 DIAGNOSIS — M4800 Spinal stenosis, site unspecified: Secondary | ICD-10-CM | POA: Diagnosis not present

## 2023-03-02 DIAGNOSIS — I10 Essential (primary) hypertension: Secondary | ICD-10-CM | POA: Diagnosis not present

## 2023-03-02 DIAGNOSIS — N400 Enlarged prostate without lower urinary tract symptoms: Secondary | ICD-10-CM | POA: Diagnosis not present

## 2023-03-02 DIAGNOSIS — K219 Gastro-esophageal reflux disease without esophagitis: Secondary | ICD-10-CM | POA: Diagnosis not present

## 2023-03-02 DIAGNOSIS — N529 Male erectile dysfunction, unspecified: Secondary | ICD-10-CM | POA: Diagnosis not present

## 2023-03-02 DIAGNOSIS — M1990 Unspecified osteoarthritis, unspecified site: Secondary | ICD-10-CM | POA: Diagnosis not present

## 2023-03-12 ENCOUNTER — Other Ambulatory Visit
Admission: RE | Admit: 2023-03-12 | Discharge: 2023-03-12 | Disposition: A | Discharge status: Home / Self Care | Payer: Medicare PPO | Attending: Urology | Admitting: Urology

## 2023-03-12 DIAGNOSIS — Z872 Personal history of diseases of the skin and subcutaneous tissue: Secondary | ICD-10-CM | POA: Diagnosis not present

## 2023-03-12 DIAGNOSIS — L821 Other seborrheic keratosis: Secondary | ICD-10-CM | POA: Diagnosis not present

## 2023-03-12 DIAGNOSIS — N390 Urinary tract infection, site not specified: Secondary | ICD-10-CM | POA: Insufficient documentation

## 2023-03-12 DIAGNOSIS — L570 Actinic keratosis: Secondary | ICD-10-CM | POA: Diagnosis not present

## 2023-03-12 DIAGNOSIS — L578 Other skin changes due to chronic exposure to nonionizing radiation: Secondary | ICD-10-CM | POA: Diagnosis not present

## 2023-03-12 DIAGNOSIS — L298 Other pruritus: Secondary | ICD-10-CM | POA: Diagnosis not present

## 2023-03-12 DIAGNOSIS — R972 Elevated prostate specific antigen [PSA]: Secondary | ICD-10-CM | POA: Insufficient documentation

## 2023-03-12 LAB — URINALYSIS, COMPLETE (UACMP) WITH MICROSCOPIC
Bilirubin Urine: NEGATIVE
Glucose, UA: NEGATIVE mg/dL
Ketones, ur: NEGATIVE mg/dL
Leukocytes,Ua: NEGATIVE
Nitrite: NEGATIVE
Protein, ur: NEGATIVE mg/dL
Specific Gravity, Urine: 1.025 (ref 1.005–1.030)
Squamous Epithelial / HPF: NONE SEEN /HPF (ref 0–5)
pH: 5.5 (ref 5.0–8.0)

## 2023-03-13 LAB — URINE CULTURE: Culture: NO GROWTH

## 2023-03-13 LAB — PSA, TOTAL AND FREE
PSA, Free Pct: 23.8 %
PSA, Free: 1.33 ng/mL
Prostate Specific Ag, Serum: 5.6 ng/mL — ABNORMAL HIGH (ref 0.0–4.0)

## 2023-03-14 ENCOUNTER — Other Ambulatory Visit: Payer: Self-pay | Admitting: Urology

## 2023-03-14 DIAGNOSIS — R972 Elevated prostate specific antigen [PSA]: Secondary | ICD-10-CM

## 2023-03-14 DIAGNOSIS — R3129 Other microscopic hematuria: Secondary | ICD-10-CM

## 2023-05-18 ENCOUNTER — Other Ambulatory Visit
Admission: RE | Admit: 2023-05-18 | Discharge: 2023-05-18 | Disposition: A | Discharge status: Home / Self Care | Payer: Medicare PPO | Attending: Urology | Admitting: Urology

## 2023-05-18 DIAGNOSIS — R3129 Other microscopic hematuria: Secondary | ICD-10-CM | POA: Diagnosis not present

## 2023-05-18 DIAGNOSIS — R972 Elevated prostate specific antigen [PSA]: Secondary | ICD-10-CM | POA: Insufficient documentation

## 2023-05-18 LAB — URINALYSIS, COMPLETE (UACMP) WITH MICROSCOPIC
Bilirubin Urine: NEGATIVE
Glucose, UA: NEGATIVE mg/dL
Leukocytes,Ua: NEGATIVE
Nitrite: NEGATIVE
Protein, ur: NEGATIVE mg/dL
Specific Gravity, Urine: >1.03 — ABNORMAL HIGH (ref 1.005–1.030)
pH: 5.5 (ref 5.0–8.0)

## 2023-05-20 ENCOUNTER — Other Ambulatory Visit: Payer: Self-pay | Admitting: Urology

## 2023-05-20 DIAGNOSIS — N138 Other obstructive and reflux uropathy: Secondary | ICD-10-CM

## 2023-05-20 DIAGNOSIS — R972 Elevated prostate specific antigen [PSA]: Secondary | ICD-10-CM

## 2023-05-20 LAB — PSA, TOTAL AND FREE
PSA, Free Pct: 23 %
PSA, Free: 1.24 ng/mL
Prostate Specific Ag, Serum: 5.4 ng/mL — ABNORMAL HIGH (ref 0.0–4.0)

## 2023-05-29 DIAGNOSIS — Z961 Presence of intraocular lens: Secondary | ICD-10-CM | POA: Diagnosis not present

## 2023-05-29 DIAGNOSIS — H43813 Vitreous degeneration, bilateral: Secondary | ICD-10-CM | POA: Diagnosis not present

## 2023-06-12 ENCOUNTER — Ambulatory Visit
Admission: RE | Admit: 2023-06-12 | Discharge: 2023-06-12 | Disposition: A | Discharge status: Home / Self Care | Payer: Medicare PPO | Source: Ambulatory Visit | Attending: Urology | Admitting: Urology

## 2023-06-12 DIAGNOSIS — N401 Enlarged prostate with lower urinary tract symptoms: Secondary | ICD-10-CM | POA: Diagnosis not present

## 2023-06-12 DIAGNOSIS — K5730 Diverticulosis of large intestine without perforation or abscess without bleeding: Secondary | ICD-10-CM | POA: Diagnosis not present

## 2023-06-12 DIAGNOSIS — R972 Elevated prostate specific antigen [PSA]: Secondary | ICD-10-CM | POA: Insufficient documentation

## 2023-06-12 DIAGNOSIS — N138 Other obstructive and reflux uropathy: Secondary | ICD-10-CM | POA: Insufficient documentation

## 2023-06-12 DIAGNOSIS — N400 Enlarged prostate without lower urinary tract symptoms: Secondary | ICD-10-CM | POA: Diagnosis not present

## 2023-06-12 MED ORDER — GADOBUTROL 1 MMOL/ML IV SOLN
9.0000 mL | Freq: Once | INTRAVENOUS | Status: AC | PRN
  Administered 2023-06-12: 9 mL via INTRAVENOUS

## 2023-11-29 DIAGNOSIS — N1831 Chronic kidney disease, stage 3a: Secondary | ICD-10-CM | POA: Diagnosis not present

## 2023-11-29 DIAGNOSIS — K219 Gastro-esophageal reflux disease without esophagitis: Secondary | ICD-10-CM | POA: Diagnosis not present

## 2023-11-29 DIAGNOSIS — Z0000 Encounter for general adult medical examination without abnormal findings: Secondary | ICD-10-CM | POA: Diagnosis not present

## 2023-11-29 DIAGNOSIS — Z1283 Encounter for screening for malignant neoplasm of skin: Secondary | ICD-10-CM | POA: Diagnosis not present

## 2023-11-29 DIAGNOSIS — Z1211 Encounter for screening for malignant neoplasm of colon: Secondary | ICD-10-CM | POA: Diagnosis not present

## 2023-11-29 DIAGNOSIS — Z1331 Encounter for screening for depression: Secondary | ICD-10-CM | POA: Diagnosis not present

## 2023-11-29 DIAGNOSIS — N401 Enlarged prostate with lower urinary tract symptoms: Secondary | ICD-10-CM | POA: Diagnosis not present

## 2023-11-29 DIAGNOSIS — M1990 Unspecified osteoarthritis, unspecified site: Secondary | ICD-10-CM | POA: Diagnosis not present

## 2023-12-17 ENCOUNTER — Other Ambulatory Visit: Payer: Self-pay | Admitting: Urology

## 2023-12-17 ENCOUNTER — Other Ambulatory Visit: Payer: Self-pay

## 2023-12-17 DIAGNOSIS — N401 Enlarged prostate with lower urinary tract symptoms: Secondary | ICD-10-CM

## 2023-12-17 DIAGNOSIS — N529 Male erectile dysfunction, unspecified: Secondary | ICD-10-CM

## 2023-12-17 MED ORDER — TADALAFIL 5 MG PO TABS: 5.0000 mg | ORAL_TABLET | Freq: Every day | ORAL | 3 refills | Status: DC | PRN

## 2023-12-27 NOTE — Progress Notes (Deleted)
 12/27/23 9:57 PM   Craig [name] [birth year] [id number]  Referring provider:  Jeffie Cheryl BRAVO, MD [address] [place],  [place] [address]  Urological history  1. ED -managed with tadalafil  5 mg daily    2. BPH with LU TS -prostate volume 566cc on 2024 prostate MRI -managed with tadalafil  5 mg daily and tamsulosin  0.4 mg every three days    3. Prostate nodule/Elevated PSA -PSA pending -nodule found incidentally on exam 1 cm nodule is appreciated in the right lobe -Prostate MRI completed 06/09/2020 - no high-grade carcinoma identified in the peripheral zone.  Linear striations in the posterior RIGHT mid gland are favored benign scarring ( PI-RADS: 2).  Nodular transitional zone most consistent with benign prostate hypertrophy ( PI-RADS: 2).  One hyperplastic nodule protrudes from the posterior RIGHT base.    -Prostate MRI completed 06/13/2022 - No significant change from comparison prostate MRI.  No high-grade carcinoma identified in the peripheral zone.  Linear striations in the peripheral zone likely related to prior prostatitis. PI-RADS: 2  Enlarged nodular transitional zone most consistent with benign prostate hypertrophy. PI-RADS: 2 -Prostate MRI (05/2023) - no focal lesion of intermediate or higher suspicion for prostate cancer is identified   4. Family history of prostate cancer -father with non-fatal prostate cancer died at the age of 75    5. Epididymal cyst -scrotal ultrasound 2023 - small 8 mm right-sided epididymal tail cyst  6. Varicocele -scrotal ultrasound 2023 - stable left varicocele  7. Atrophic left testicle -scrotal ultrasound 2023 - Stable heterogeneous slightly atrophic left testicle with internal calcifications, consistent with prior traumatic insult or inflammatory/infectious process.   6. Nephrolithiasis -spontaneous passage in 2008  No chief complaint on file.   HPI: Craig [name] is a 73 y.o.male who returns for 6 month  follow-up.  Previous records reviewed.     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   Vitamin D deficiency     Surgical History: Past Surgical History:  Procedure Laterality Date   ANTERIOR RELEASE VERTEBRAL BODY W/ POSTERIOR FUSION     APPENDECTOMY     CATARACT EXTRACTION W/PHACO Right 08/21/2017   Procedure: CATARACT EXTRACTION PHACO AND INTRAOCULAR LENS PLACEMENT (IOC) RIGHT;  Surgeon: Myrna Adine Anes, MD;  Location: [facility];  Service: Ophthalmology;  Laterality: Right;   CATARACT EXTRACTION W/PHACO Left 09/18/2017   Procedure: CATARACT EXTRACTION PHACO AND INTRAOCULAR LENS PLACEMENT (IOC) LEFT;  Surgeon: Myrna Adine Anes, MD;  Location: [facility];  Service: Ophthalmology;  Laterality: Left;   COLONOSCOPY WITH PROPOFOL  N/A 12/06/2017   Procedure: COLONOSCOPY WITH PROPOFOL ;  Surgeon: Jinny Carmine, MD;  Location: [facility];  Service: Endoscopy;  Laterality: N/A;   HERNIA REPAIR  1975/1960   LEG SURGERY Right 2012   NECK SURGERY     POLYPECTOMY N/A 12/06/2017   Procedure: POLYPECTOMY;  Surgeon: Jinny Carmine, MD;  Location: [facility];  Service: Endoscopy;  Laterality: N/A;   ruptured disc     VARICOCELECTOMY      Home Medications:  Allergies as of 12/31/2023       Reactions   Ivp Dye [iodinated Contrast  Media] Anaphylaxis        Medication List        Accurate as of December 27, 2023  9:57 PM. If you have any questions, ask your nurse or doctor.          ampicillin  500 MG capsule Commonly known as: PRINCIPEN Take 1 capsule (500 mg total) by mouth 4 (four) times daily.   ibuprofen 200 MG tablet Commonly known  as: ADVIL Take 200 mg by mouth 2 (two) times daily. 3 in am 2 at night   nitrofurantoin  (macrocrystal-monohydrate) 100 MG capsule Commonly known as: MACROBID  Take 1 capsule (100 mg total) by mouth every 12 (twelve) hours.   omeprazole 20 MG capsule Commonly known as: PRILOSEC Take 20 mg by mouth daily.   tadalafil  5 MG tablet Commonly known as: CIALIS  Take 1 tablet (5 mg total) by mouth daily as needed for erectile dysfunction.   tamsulosin  0.4 MG Caps capsule Commonly known as: FLOMAX  TAKE ONE CAPSULE BY MOUTH ONE TIME DAILY        Allergies:  Allergies  Allergen Reactions   Ivp Dye [Iodinated Contrast Media] Anaphylaxis    Family History: Family History  Problem Relation Age of Onset   Kidney cancer Neg Hx    Prostate cancer Neg Hx    Kidney disease Neg Hx     Social History:  reports that he has quit smoking. His smoking use included cigarettes. He has never used smokeless tobacco. He reports current alcohol use. He reports that he does not use drugs.   Physical Exam: There were no vitals taken for this visit.  Constitutional:  Well nourished. Alert and oriented, No acute distress. HEENT: [place] AT, moist mucus membranes.  Trachea midline, no masses. Cardiovascular: No clubbing, cyanosis, or edema. Respiratory: Normal respiratory effort, no increased work of breathing. GI: Abdomen is soft, non tender, non distended, no abdominal masses. Liver and spleen not palpable.  No hernias appreciated.  Stool sample for occult testing is not indicated.   GU: No CVA tenderness.  No bladder fullness or masses.  Patient with circumcised/uncircumcised phallus. ***Foreskin easily retracted***  Urethral meatus is patent.  No penile discharge. No penile lesions or rashes. Scrotum without lesions, cysts, rashes and/or edema.  Testicles are located scrotally bilaterally. No masses are appreciated in the testicles. Left and right epididymis are normal. Rectal: Patient with  normal sphincter  tone. Anus and perineum without scarring or rashes. No rectal masses are appreciated. Prostate is approximately *** grams, *** nodules are appreciated. Seminal vesicles are normal. Skin: No rashes, bruises or suspicious lesions. Lymph: No cervical or inguinal adenopathy. Neurologic: Grossly intact, no focal deficits, moving all 4 extremities. Psychiatric: Normal mood and affect.   Laboratory Data: See HPI and EPIC I have reviewed the labs.   Pertinent Imaging N/A  Assessment & Plan:    Prostate nodule  - ***  2. BPH with LU TS - stable, improving, worsening mild, moderate severe symptoms *** - no signs of retention, infection or malignancy *** - PSA up to date *** - DRE benign *** - UA benign *** - PVR < 300 cc *** - most bothersome symptoms are *** - encouraged avoiding bladder irritants, fluid restriction before bedtime and timed voiding's - Initiate alpha-blocker (***), discussed side effects *** - Initiate 5 alpha reductase inhibitor (***), discussed side effects *** - Continue tamsulosin  0.4 mg daily, alfuzosin 10 mg daily, Rapaflo 8 mg daily, terazosin, doxazosin, Cialis  5 mg daily and finasteride 5 mg daily,  dutasteride 0.5 mg daily***:refills given - Cannot tolerate medication or medication failure, schedule cystoscopy *** - educated on red flag symptoms: acute retention, gross hematuria, fever, severe pain - advised to call clinic or go to the ED if these occur - return to clinic in *** symptom re-evaluation ***   4. Right epididymal cyst  - ***  5. Left varicocele  - ***  No follow-ups on file.  Craig [name]   [facility] [address], [address] [place], [place] [address] [phone]

## 2023-12-31 ENCOUNTER — Ambulatory Visit: Payer: Self-pay | Admitting: Urology

## 2023-12-31 DIAGNOSIS — N138 Other obstructive and reflux uropathy: Secondary | ICD-10-CM

## 2023-12-31 DIAGNOSIS — R972 Elevated prostate specific antigen [PSA]: Secondary | ICD-10-CM

## 2023-12-31 DIAGNOSIS — I861 Scrotal varices: Secondary | ICD-10-CM

## 2023-12-31 DIAGNOSIS — N503 Cyst of epididymis: Secondary | ICD-10-CM

## 2023-12-31 DIAGNOSIS — N529 Male erectile dysfunction, unspecified: Secondary | ICD-10-CM

## 2024-01-09 ENCOUNTER — Telehealth: Payer: Self-pay

## 2024-01-09 NOTE — Telephone Encounter (Signed)
 Pt called the triage line stating he is having right testicular pain, had noticed some swelling. Pt has an appointment with Clotilda on 07/21. Informed pt his upcoming appt is our soonest available.  In the meantime, I recommended pt to seek evaluation in urgent care center or with his primary care provider. Pt voiced understanding.

## 2024-01-10 ENCOUNTER — Ambulatory Visit

## 2024-01-10 ENCOUNTER — Other Ambulatory Visit: Payer: Self-pay | Admitting: Family Medicine

## 2024-01-10 ENCOUNTER — Ambulatory Visit
Admission: RE | Admit: 2024-01-10 | Discharge: 2024-01-10 | Disposition: A | Discharge status: Home / Self Care | Source: Ambulatory Visit | Attending: Family Medicine | Admitting: Family Medicine

## 2024-01-10 DIAGNOSIS — N433 Hydrocele, unspecified: Secondary | ICD-10-CM | POA: Diagnosis not present

## 2024-01-10 DIAGNOSIS — N503 Cyst of epididymis: Secondary | ICD-10-CM | POA: Diagnosis not present

## 2024-01-10 DIAGNOSIS — N50811 Right testicular pain: Secondary | ICD-10-CM | POA: Diagnosis not present

## 2024-01-10 DIAGNOSIS — G8929 Other chronic pain: Secondary | ICD-10-CM | POA: Diagnosis not present

## 2024-01-10 DIAGNOSIS — N50819 Testicular pain, unspecified: Secondary | ICD-10-CM | POA: Diagnosis not present

## 2024-01-10 DIAGNOSIS — N5089 Other specified disorders of the male genital organs: Secondary | ICD-10-CM | POA: Diagnosis not present

## 2024-01-10 DIAGNOSIS — N50812 Left testicular pain: Secondary | ICD-10-CM | POA: Diagnosis not present

## 2024-01-11 ENCOUNTER — Other Ambulatory Visit: Payer: Self-pay

## 2024-01-11 DIAGNOSIS — R3129 Other microscopic hematuria: Secondary | ICD-10-CM

## 2024-01-13 NOTE — Progress Notes (Unsigned)
 01/14/24 3:12 PM   Craig [name] Greet 1951/06/19 [id number]  Referring provider:  Jeffie Cheryl BRAVO, MD [address] [place],  [place] [address]  Urological history  1. ED -managed with tadalafil  5 mg daily    2. BPH with LU TS -prostate volume 566cc on 2024 prostate MRI -managed with tadalafil  5 mg daily and tamsulosin  0.4 mg every three days    3. Prostate nodule/Elevated PSA -PSA pending -nodule found incidentally on exam 1 cm nodule is appreciated in the right lobe -Prostate MRI completed 06/09/2020 - no high-grade carcinoma identified in the peripheral zone.  Linear striations in the posterior RIGHT mid gland are favored benign scarring ( PI-RADS: 2).  Nodular transitional zone most consistent with benign prostate hypertrophy ( PI-RADS: 2).  One hyperplastic nodule protrudes from the posterior RIGHT base.    -Prostate MRI completed 06/13/2022 - No significant change from comparison prostate MRI.  No high-grade carcinoma identified in the peripheral zone.  Linear striations in the peripheral zone likely related to prior prostatitis. PI-RADS: 2  Enlarged nodular transitional zone most consistent with benign prostate hypertrophy. PI-RADS: 2 -Prostate MRI (05/2023) - no focal lesion of intermediate or higher suspicion for prostate cancer is identified   4. Family history of prostate cancer -father with non-fatal prostate cancer died at the age of 67    5. Epididymal cyst -scrotal ultrasound 2023 - small 8 mm right-sided epididymal tail cyst  6. Varicocele -scrotal ultrasound 2023 - stable left varicocele  7. Atrophic left testicle -scrotal ultrasound 2023 - Stable heterogeneous slightly atrophic left testicle with internal calcifications, consistent with prior traumatic insult or inflammatory/infectious process.   6. Nephrolithiasis -spontaneous passage in 2008  Chief Complaint  Patient presents with   Follow-up    HPI: Craig [name] is a 73 y.o.male who returns for 6  month follow-up.  Previous records reviewed.     He was seen by his PCP, Dr. Jeffie, on January 10, 2024 for right sided scrotal/testicular pain that started about 1 to 2 weeks ago.  His urinalysis with 4-10 WBCs and 4-10 RBCs.   Scrotal ultrasound performed on January 08, 2024 had findings suspicious for right epididymitis and right scrotal skin thickening associated with a simple right-sided hydrocele.  He also has left varicocele.  He is placed on Cipro  500 mg twice daily for 10 days and tramadol .  He states that the swelling is still present, but the pain is somewhat diminished.  Patient denies any modifying or aggravating factors.  Patient denies any recent UTI's, gross hematuria, dysuria or suprapubic/flank pain.  Patient denies any fevers, chills, nausea or vomiting.    Today's UA negative for micro heme.  His pain has decreased, but the induration is still present.  He has 5 more days left on his Cipro .  Patient denies any modifying or aggravating factors.  Patient denies any recent UTI's, gross hematuria, dysuria or suprapubic/flank pain.  Patient denies any fevers, chills, nausea or vomiting.    He has a history of epididymal cyst and wants it removed after the infection is cleared.  Serum creatinine (11/2023) 1.1, eGFR 71  Lipids (11/2023) WNL  TSH (11/2023) 3.337  He is also in need of refills on his Cialis  and Flomax .  He would also like refill of tramadol .  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   Vitamin D deficiency     Surgical History: Past Surgical History:  Procedure Laterality Date   ANTERIOR RELEASE VERTEBRAL BODY W/ POSTERIOR FUSION     APPENDECTOMY     CATARACT EXTRACTION W/PHACO Right 08/21/2017   Procedure: CATARACT EXTRACTION PHACO AND  INTRAOCULAR LENS PLACEMENT (IOC) RIGHT;  Surgeon: Myrna Adine Anes, MD;  Location: [facility];  Service: Ophthalmology;  Laterality: Right;   CATARACT EXTRACTION W/PHACO Left 09/18/2017   Procedure: CATARACT EXTRACTION PHACO AND INTRAOCULAR LENS PLACEMENT (IOC) LEFT;  Surgeon: Myrna Adine Anes, MD;  Location: [facility];  Service: Ophthalmology;  Laterality: Left;   COLONOSCOPY WITH PROPOFOL  N/A 12/06/2017   Procedure: COLONOSCOPY WITH PROPOFOL ;  Surgeon: Jinny Carmine, MD;  Location: [facility];  Service: Endoscopy;  Laterality: N/A;   HERNIA REPAIR  1975/1960   LEG SURGERY Right 2012   NECK SURGERY     POLYPECTOMY N/A 12/06/2017   Procedure: POLYPECTOMY;  Surgeon: Jinny Carmine, MD;  Location: [facility];  Service: Endoscopy;  Laterality: N/A;   ruptured disc     VARICOCELECTOMY      Home Medications:  Allergies as of 01/14/2024       Reactions   Ivp Dye [iodinated Contrast Media] Anaphylaxis        Medication List        Accurate as of January 14, 2024  3:12 PM. If you have any questions, ask your nurse or doctor.          ampicillin  500 MG capsule Commonly known as: PRINCIPEN Take 1 capsule (500 mg total) by mouth 4 (four) times daily.   ibuprofen 200 MG tablet Commonly known as: ADVIL Take 200 mg by mouth 2 (two) times daily. 3 in am 2 at night   nitrofurantoin  (macrocrystal-monohydrate) 100 MG capsule Commonly known as: MACROBID  Take 1 capsule (100 mg total) by mouth every 12 (twelve) hours.   omeprazole 20 MG capsule Commonly known as: PRILOSEC Take 20 mg by mouth daily.   tadalafil  5 MG tablet Commonly known as: CIALIS  Take 1 tablet (5 mg total) by mouth daily as needed for erectile dysfunction.   tamsulosin  0.4 MG Caps capsule Commonly known as: FLOMAX  Take 1 capsule (0.4 mg total) by mouth daily.   traMADol  50 MG tablet Commonly known as: Ultram  Take 1 tablet (50 mg total) by mouth every 6 (six) hours as  needed. Started by: CLOTILDA CORNWALL        Allergies:  Allergies  Allergen Reactions   Ivp Dye [Iodinated Contrast Media] Anaphylaxis    Family History: Family History  Problem Relation Age of Onset   Kidney cancer Neg Hx    Prostate cancer Neg Hx    Kidney disease Neg Hx     Social History:  reports that he has quit smoking. His smoking use included cigarettes. He has never used smokeless tobacco. He reports current alcohol use. He reports that he does not use drugs.   Physical Exam: BP 131/73 (BP Location: Right Arm, Patient Position: Sitting, Cuff Size: Normal)   Pulse 67   Ht 5' 8 (1.727 m)   Wt 197 lb (89.4 kg)   SpO2 97%   BMI 29.95 kg/m   Constitutional:  Well nourished. Alert and oriented, No acute distress. HEENT: [place] AT, moist mucus membranes.  Trachea midline Cardiovascular: No clubbing, cyanosis, or edema. Respiratory: Normal respiratory effort, no increased work of breathing. GU:  No CVA tenderness.  No bladder fullness or masses.  Patient with uncircumcised phallus. Foreskin easily retracted  Urethral meatus is patent.  No penile discharge. No penile lesions or rashes. Left scrotum without lesions, cysts, rashes and/or edema.  Left testicle is located scrotally. No masses are appreciated in the testicles. Left epididymis is normal.   Left varicocele is noted.  Right hemiscrotum without lesions, cysts, rashes and/or edema.  The right epididymis and testicle are indurated and exquisitely tender.  There is no fluctuant mass or crepitus noted.  Rectal: Patient with  normal sphincter tone. Anus and perineum without scarring or rashes. No rectal masses are appreciated. Prostate is approximately 50 grams, no nodules are appreciated. Seminal vesicles could not be palpated.  Neurologic: Grossly intact, no focal deficits, moving all 4 extremities. Psychiatric: Normal mood and affect.    Laboratory Data: See HPI and EPIC I have reviewed the labs.   Pertinent  Imaging CLINICAL DATA:  Testicular pain on the left.   EXAM: SCROTAL ULTRASOUND   DOPPLER ULTRASOUND OF THE TESTICLES   TECHNIQUE: Complete ultrasound examination of the testicles, epididymis, and other scrotal structures was performed. Color and spectral Doppler ultrasound were also utilized to evaluate blood flow to the testicles.   COMPARISON:  Scrotal ultrasound 01/05/2022.   FINDINGS: Right testicle   Measurements: 3.4 x 2.5 x 2.8 cm. No mass or microlithiasis visualized.   Left testicle   Measurements: 2.4 x 1.2 x 1.7 cm. Markedly heterogeneous echotexture diffusely with calcifications and areas of hypoechogenicity similar to prior.   Right epididymis: Enlarged and heterogeneous (1.2 x 1.6 x 2.3 cm) with increased color Doppler flow throughout. Epididymal head cyst has decreased in size now measuring 4 x 3 x 3 mm.   Left epididymis:  Normal in size and appearance.   Hydrocele:  Simple right-sided hydrocele present.   Varicocele:  Left varicocele present.   Pulsed Doppler interrogation of both testes demonstrates normal low resistance arterial and venous waveforms bilaterally.   Other: There is right scrotal skin thickening.   IMPRESSION: 1. Enlarged and heterogeneous right epididymis with increased color Doppler flow throughout. Findings are compatible with epididymitis. 2. Right scrotal skin thickening. 3. Simple right-sided hydrocele. 4. Left varicocele. 5. Markedly heterogeneous echotexture of the left testicle with calcifications and areas of hypoechogenicity similar to prior.     Electronically Signed   By: Greig Pique M.D.   On: 01/10/2024 15:39 I have independently reviewed the films.  See HPI.    Assessment & Plan:    Prostate nodule  - no nodules palpated   2. BPH with LU TS - stable symptoms  - PSA pending, but since he is actively infected, I advised him that may be falsely elevated and it will need to be repeated - DRE benign  -  UA benign  - encouraged avoiding bladder irritants, fluid restriction before bedtime and timed voiding's - Continue tamsulosin  0.4 mg daily and Cialis  5 mg daily   4. Right epididymal cyst  - He would like to have this removed once the infection is cleared  5. Left varicocele  - Not bothersome  6.  Right epididymitis orchitis - Seems to be resolving on antibiotics - Advised him to finish the Cipro  to completion - Discussed that the pain should worsen, swelling recur, he experienced drainage or fevers, to contact the office immediately or seek treatment in the  Return in about 1 month (around 02/14/2024) for symptom recheck and exam .  Ramell Wacha, PA-C  [facility] [address] [place], [place] [address] [phone]

## 2024-01-14 ENCOUNTER — Other Ambulatory Visit
Admission: RE | Admit: 2024-01-14 | Discharge: 2024-01-14 | Disposition: A | Discharge status: Home / Self Care | Attending: Urology | Admitting: Urology

## 2024-01-14 ENCOUNTER — Ambulatory Visit: Admitting: Urology

## 2024-01-14 ENCOUNTER — Other Ambulatory Visit: Payer: Self-pay

## 2024-01-14 ENCOUNTER — Encounter: Payer: Self-pay | Admitting: Urology

## 2024-01-14 VITALS — BP 131/73 | HR 67 | Ht 68.0 in | Wt 197.0 lb

## 2024-01-14 DIAGNOSIS — N401 Enlarged prostate with lower urinary tract symptoms: Secondary | ICD-10-CM | POA: Diagnosis not present

## 2024-01-14 DIAGNOSIS — N138 Other obstructive and reflux uropathy: Secondary | ICD-10-CM | POA: Diagnosis not present

## 2024-01-14 DIAGNOSIS — R3129 Other microscopic hematuria: Secondary | ICD-10-CM | POA: Insufficient documentation

## 2024-01-14 DIAGNOSIS — R972 Elevated prostate specific antigen [PSA]: Secondary | ICD-10-CM

## 2024-01-14 DIAGNOSIS — N529 Male erectile dysfunction, unspecified: Secondary | ICD-10-CM | POA: Diagnosis not present

## 2024-01-14 DIAGNOSIS — N453 Epididymo-orchitis: Secondary | ICD-10-CM

## 2024-01-14 LAB — URINALYSIS, COMPLETE (UACMP) WITH MICROSCOPIC
Bilirubin Urine: NEGATIVE
Glucose, UA: NEGATIVE mg/dL
Leukocytes,Ua: NEGATIVE
Nitrite: NEGATIVE
Specific Gravity, Urine: >1.03 — ABNORMAL HIGH (ref 1.005–1.030)
pH: 5.5 (ref 5.0–8.0)

## 2024-01-14 MED ORDER — TADALAFIL 5 MG PO TABS: 5.0000 mg | ORAL_TABLET | Freq: Every day | ORAL | 3 refills | Status: DC | PRN

## 2024-01-14 MED ORDER — TAMSULOSIN HCL 0.4 MG PO CAPS: 0.4000 mg | ORAL_CAPSULE | Freq: Every day | ORAL | 3 refills | Status: DC

## 2024-01-14 MED ORDER — TRAMADOL HCL 50 MG PO TABS: 50.0000 mg | ORAL_TABLET | Freq: Four times a day (QID) | ORAL | 0 refills | Status: DC | PRN

## 2024-01-15 ENCOUNTER — Ambulatory Visit: Payer: Self-pay | Admitting: Urology

## 2024-01-15 LAB — PSA, TOTAL AND FREE
PSA, Free Pct: 19.4 %
PSA, Free: 1.24 ng/mL
Prostate Specific Ag, Serum: 6.4 ng/mL — ABNORMAL HIGH (ref 0.0–4.0)

## 2024-01-17 ENCOUNTER — Other Ambulatory Visit: Payer: Self-pay | Admitting: Urology

## 2024-01-17 MED ORDER — CIPROFLOXACIN HCL 500 MG PO TABS: 500.0000 mg | ORAL_TABLET | Freq: Two times a day (BID) | ORAL | 0 refills | Status: AC

## 2024-02-11 ENCOUNTER — Ambulatory Visit: Admitting: Dermatology

## 2024-02-11 ENCOUNTER — Ambulatory Visit: Admitting: Urology

## 2024-02-11 ENCOUNTER — Encounter: Payer: Self-pay | Admitting: Dermatology

## 2024-02-11 DIAGNOSIS — L814 Other melanin hyperpigmentation: Secondary | ICD-10-CM

## 2024-02-11 DIAGNOSIS — D2272 Melanocytic nevi of left lower limb, including hip: Secondary | ICD-10-CM

## 2024-02-11 DIAGNOSIS — L821 Other seborrheic keratosis: Secondary | ICD-10-CM

## 2024-02-11 DIAGNOSIS — Z1283 Encounter for screening for malignant neoplasm of skin: Secondary | ICD-10-CM

## 2024-02-11 DIAGNOSIS — L578 Other skin changes due to chronic exposure to nonionizing radiation: Secondary | ICD-10-CM | POA: Diagnosis not present

## 2024-02-11 DIAGNOSIS — D229 Melanocytic nevi, unspecified: Secondary | ICD-10-CM

## 2024-02-11 DIAGNOSIS — Z808 Family history of malignant neoplasm of other organs or systems: Secondary | ICD-10-CM | POA: Diagnosis not present

## 2024-02-11 DIAGNOSIS — D1801 Hemangioma of skin and subcutaneous tissue: Secondary | ICD-10-CM

## 2024-02-11 NOTE — Patient Instructions (Addendum)

## 2024-02-11 NOTE — Progress Notes (Signed)
   New Patient Visit   Subjective  Craig [name] is a 73 y.o. male who presents for the following: Skin Cancer Screening and Full Body Skin Exam check L cheek, spot that itches at night, no treatment, no hx of skin cancer, Mother with hx of skin cancer, not sure type  New patient referral from Dr. Cheryl Chol.  The patient presents for Total-Body Skin Exam (TBSE) for skin cancer screening and mole check. The patient has spots, moles and lesions to be evaluated, some may be new or changing and the patient may have concern these could be cancer.    The following portions of the chart were reviewed this encounter and updated as appropriate: medications, allergies, medical history  Review of Systems:  No other skin or systemic complaints except as noted in HPI or Assessment and Plan.  Objective  Well appearing patient in no apparent distress; mood and affect are within normal limits.  A full examination was performed including scalp, head, eyes, ears, nose, lips, neck, chest, axillae, abdomen, back, buttocks, bilateral upper extremities, bilateral lower extremities, hands, feet, fingers, toes, fingernails, and toenails. All findings within normal limits unless otherwise noted below.   Relevant physical exam findings are noted in the Assessment and Plan.    Assessment & Plan   SKIN CANCER SCREENING PERFORMED TODAY.  ACTINIC DAMAGE - Chronic condition, secondary to cumulative UV/sun exposure - diffuse scaly erythematous macules with underlying dyspigmentation - Recommend daily broad spectrum sunscreen SPF 30+ to sun-exposed areas, reapply every 2 hours as needed.  - Staying in the shade or wearing long sleeves, sun glasses (UVA+UVB protection) and wide brim hats (4-inch brim around the entire circumference of the hat) are also recommended for sun protection.  - Call for new or changing lesions.  LENTIGINES, SEBORRHEIC KERATOSES, HEMANGIOMAS - Benign normal skin lesions -  Benign-appearing - Call for any changes  MELANOCYTIC NEVI - Tan-brown and/or pink-flesh-colored symmetric macules and papules - Benign appearing on exam today - Observation - Call clinic for new or changing moles - Recommend daily use of broad spectrum spf 30+ sunscreen to sun-exposed areas.   FAMILY HISTORY OF SKIN CANCER What type(s): not sure type Who affected: mother   NEVUS vs NEUROFIBROMA L lat foot near heel Exam: flesh pap  Treatment Plan: Benign-appearing.  Observation.  Call clinic for new or changing lesions.  Recommend daily use of broad spectrum spf 30+ sunscreen to sun-exposed areas.    MULTIPLE BENIGN NEVI   SEBORRHEIC KERATOSES   CHERRY ANGIOMA   LENTIGINES   ACTINIC ELASTOSIS    Return in about 1 year (around 02/10/2025) for TBSE.  I, Grayce Saunas, RMA, am acting as scribe for Boneta Sharps, MD .   Documentation: I have reviewed the above documentation for accuracy and completeness, and I agree with the above.  Boneta Sharps, MD

## 2024-03-02 NOTE — Progress Notes (Unsigned)
 03/03/24 3:00 PM   Craig [name] March 28, 1951 [id number]  Referring provider:  Jeffie Cheryl BRAVO, MD [address] [place],  [place] [address]  Urological history  1. ED -managed with tadalafil  5 mg daily    2. BPH with LU TS -prostate volume 56 cc on 2024 prostate MRI -managed with tadalafil  5 mg daily and tamsulosin  0.4 mg every three days    3. Prostate nodule/Elevated PSA -PSA pending -nodule found incidentally on exam 1 cm nodule is appreciated in the right lobe -Prostate MRI completed 06/09/2020 - no high-grade carcinoma identified in the peripheral zone.  Linear striations in the posterior RIGHT mid gland are favored benign scarring ( PI-RADS: 2).  Nodular transitional zone most consistent with benign prostate hypertrophy ( PI-RADS: 2).  One hyperplastic nodule protrudes from the posterior RIGHT base.    -Prostate MRI completed 06/13/2022 - No significant change from comparison prostate MRI.  No high-grade carcinoma identified in the peripheral zone.  Linear striations in the peripheral zone likely related to prior prostatitis. PI-RADS: 2  Enlarged nodular transitional zone most consistent with benign prostate hypertrophy. PI-RADS: 2 -Prostate MRI (05/2023) - no focal lesion of intermediate or higher suspicion for prostate cancer is identified   4. Family history of prostate cancer -father with non-fatal prostate cancer died at the age of 36    5. Epididymal cyst -scrotal ultrasound 2023 - small 8 mm right-sided epididymal tail cyst  6. Varicocele -scrotal ultrasound 2023 - stable left varicocele  7. Atrophic left testicle -scrotal ultrasound 2023 - Stable heterogeneous slightly atrophic left testicle with internal calcifications, consistent with prior traumatic insult or inflammatory/infectious process.   6. Nephrolithiasis -spontaneous passage in 2008  Chief Complaint  Patient presents with   Orchitis and epidiymitis    HPI: Craig [name] is a 73 y.o.male  who returns for 1 month follow-up.  Previous records reviewed.     I saw him in July for 71-month follow-up, but in the interim he had been diagnosed with epididymitis by his PCP placed on antibiotics.  Scrotal ultrasound performed on January 08, 2024 and had findings suspicious for right epididymitis and right scrotal skin thickening associated with a simple right sided hydrocele.  He is also found to have a incidental left varicocele.  He is placed on Cipro  500 mg twice daily for 10 days along with tramadol .  I extended his Cipro  prescription.    His PSA was drawn in July as well while he was dealing with his infection and it returned elevated at 6.4.  Urine culture was negative.  Today, he feels pretty much back to normal.  He still has some slight pain in the testicle area.  It occurs just when he touches the area.  He does not have any scrotal swelling or scrotal drainage.  Patient denies any modifying or aggravating factors.  Patient denies any recent UTI's, gross hematuria, dysuria or suprapubic/flank pain.  Patient denies any fevers, chills, nausea or vomiting.    He has urinary frequency, urgency, leakage and a weak urinary stream when he does not take the tamsulosin  0.4 mg and the tadalafil  5 mg.  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   Vitamin D deficiency     Surgical History:  Past Surgical History:  Procedure Laterality Date   ANTERIOR RELEASE VERTEBRAL BODY W/ POSTERIOR FUSION     APPENDECTOMY     CATARACT EXTRACTION W/PHACO Right 08/21/2017   Procedure: CATARACT EXTRACTION PHACO AND INTRAOCULAR LENS PLACEMENT (IOC) RIGHT;  Surgeon: Myrna Adine Anes, MD;  Location: [facility];  Service: Ophthalmology;  Laterality: Right;   CATARACT EXTRACTION  W/PHACO Left 09/18/2017   Procedure: CATARACT EXTRACTION PHACO AND INTRAOCULAR LENS PLACEMENT (IOC) LEFT;  Surgeon: Myrna Adine Anes, MD;  Location: [facility];  Service: Ophthalmology;  Laterality: Left;   COLONOSCOPY WITH PROPOFOL  N/A 12/06/2017   Procedure: COLONOSCOPY WITH PROPOFOL ;  Surgeon: Jinny Carmine, MD;  Location: [facility];  Service: Endoscopy;  Laterality: N/A;   HERNIA REPAIR  1975/1960   LEG SURGERY Right 2012   NECK SURGERY     POLYPECTOMY N/A 12/06/2017   Procedure: POLYPECTOMY;  Surgeon: Jinny Carmine, MD;  Location: [facility];  Service: Endoscopy;  Laterality: N/A;   ruptured disc     VARICOCELECTOMY      Home Medications:  Allergies as of 03/03/2024       Reactions   Ivp Dye [iodinated Contrast Media] Anaphylaxis        Medication List        Accurate as of March 03, 2024  3:00 PM. If you have any questions, ask your nurse or doctor.          ibuprofen 200 MG tablet Commonly known as: ADVIL Take 200 mg by mouth 2 (two) times daily. 3 in am 2 at night   omeprazole 20 MG capsule Commonly known as: PRILOSEC Take 20 mg by mouth daily.   tadalafil  5 MG tablet Commonly known as: CIALIS  Take 1 tablet (5 mg total) by mouth daily as needed for erectile dysfunction.   tamsulosin  0.4 MG Caps capsule Commonly known as: FLOMAX  Take 1 capsule (0.4 mg total) by mouth daily.   traMADol  50 MG tablet Commonly known as: Ultram  Take 1 tablet (50 mg total) by mouth every 6 (six) hours as needed.        Allergies:  Allergies  Allergen Reactions   Ivp Dye [Iodinated Contrast Media] Anaphylaxis    Family History: Family History  Problem Relation Age of Onset   Kidney cancer Neg Hx    Prostate cancer Neg Hx    Kidney disease Neg Hx     Social History:  reports that he has quit smoking. His smoking use included cigarettes. He has never used smokeless tobacco. He reports current alcohol use. He reports that he does not use  drugs.   Physical Exam: BP (!) 161/77 (BP Location: Left Arm, Patient Position: Sitting, Cuff Size: Large)   Pulse 60   Wt 195 lb (88.5 kg)   SpO2 99%   BMI 29.65 kg/m   Constitutional:  Well nourished. Alert and oriented, No acute distress. HEENT: [place] AT, moist mucus membranes.  Trachea midline Cardiovascular: No clubbing, cyanosis, or edema. Respiratory: Normal respiratory effort, no increased work of breathing. GU: No CVA tenderness.  No bladder fullness or masses.  Patient with uncircumcised phallus. Foreskin easily retracted  Urethral meatus is patent.  No penile discharge. No penile lesions or rashes. Scrotum without lesions, cysts, rashes and/or edema.  Testicles are located scrotally bilaterally. No masses are appreciated in the testicles. Left epididymis is normal.  Right epididymis is indurated, but it is not tender. Neurologic: Grossly intact, no focal deficits, moving all 4 extremities. Psychiatric: Normal mood and  affect.   Laboratory Data: See HPI and EPIC I have reviewed the labs.   Pertinent Imaging N/A  Assessment & Plan:    1.  Right epididymitis orchitis -  resolved - He is very concerned as to why this happened to him and he is very concerned that it may occur again especially since he had a difficult time getting in to be seen, so we compromised on giving him another prescription for Cipro  500 mg twice daily, but he will not take it unless he starts to have the intense testicular pain again and he will notify me if he has to start the medication  2. BPH with LU TS - repeat PSA pending - stable symptoms  - continue tamsulosin  0.4 mg every three days and tadalafil  5 mg daily  3. Elevated PSA - Today's repeat free and total PSA are pending    No follow-ups on file.  Craig [name]   [facility] [address] [place], [place] [address] [phone]

## 2024-03-03 ENCOUNTER — Encounter: Payer: Self-pay | Admitting: Urology

## 2024-03-03 ENCOUNTER — Ambulatory Visit: Admitting: Urology

## 2024-03-03 ENCOUNTER — Other Ambulatory Visit
Admission: RE | Admit: 2024-03-03 | Discharge: 2024-03-03 | Disposition: A | Discharge status: Home / Self Care | Attending: Urology | Admitting: Urology

## 2024-03-03 VITALS — BP 161/77 | HR 60 | Wt 195.0 lb

## 2024-03-03 DIAGNOSIS — R972 Elevated prostate specific antigen [PSA]: Secondary | ICD-10-CM | POA: Insufficient documentation

## 2024-03-03 DIAGNOSIS — N138 Other obstructive and reflux uropathy: Secondary | ICD-10-CM

## 2024-03-03 DIAGNOSIS — N401 Enlarged prostate with lower urinary tract symptoms: Secondary | ICD-10-CM

## 2024-03-03 DIAGNOSIS — N453 Epididymo-orchitis: Secondary | ICD-10-CM

## 2024-03-03 DIAGNOSIS — N529 Male erectile dysfunction, unspecified: Secondary | ICD-10-CM

## 2024-03-03 DIAGNOSIS — I861 Scrotal varices: Secondary | ICD-10-CM

## 2024-03-03 LAB — URINALYSIS, COMPLETE (UACMP) WITH MICROSCOPIC
Bilirubin Urine: NEGATIVE
Glucose, UA: NEGATIVE mg/dL
Ketones, ur: NEGATIVE mg/dL
Leukocytes,Ua: NEGATIVE
Nitrite: NEGATIVE
Protein, ur: NEGATIVE mg/dL
Specific Gravity, Urine: 1.02 (ref 1.005–1.030)
Squamous Epithelial / HPF: NONE SEEN /HPF (ref 0–5)
WBC, UA: NONE SEEN WBC/hpf (ref 0–5)
pH: 5.5 (ref 5.0–8.0)

## 2024-03-03 MED ORDER — TADALAFIL 5 MG PO TABS
5.0000 mg | ORAL_TABLET | Freq: Every day | ORAL | 3 refills | Status: AC | PRN
Start: 2024-03-03 — End: ?

## 2024-03-03 MED ORDER — CIPROFLOXACIN HCL 500 MG PO TABS: 500.0000 mg | ORAL_TABLET | Freq: Two times a day (BID) | ORAL | 0 refills | Status: AC

## 2024-03-03 MED ORDER — TAMSULOSIN HCL 0.4 MG PO CAPS: 0.4000 mg | ORAL_CAPSULE | Freq: Every day | ORAL | 3 refills | Status: AC

## 2024-03-04 ENCOUNTER — Ambulatory Visit: Payer: Self-pay | Admitting: Urology

## 2024-03-04 LAB — PSA, TOTAL AND FREE
PSA, Free Pct: 26.2 %
PSA, Free: 1.39 ng/mL
Prostate Specific Ag, Serum: 5.3 ng/mL — ABNORMAL HIGH (ref 0.0–4.0)

## 2024-05-13 DIAGNOSIS — Z860101 Personal history of adenomatous and serrated colon polyps: Secondary | ICD-10-CM | POA: Diagnosis not present

## 2024-05-13 DIAGNOSIS — K219 Gastro-esophageal reflux disease without esophagitis: Secondary | ICD-10-CM | POA: Diagnosis not present

## 2024-05-13 DIAGNOSIS — K5730 Diverticulosis of large intestine without perforation or abscess without bleeding: Secondary | ICD-10-CM | POA: Diagnosis not present

## 2024-05-19 NOTE — Anesthesia Preprocedure Evaluation (Addendum)
 Anesthesia Evaluation  Patient identified by MRN, date of birth, ID band Patient awake    Reviewed: Allergy & Precautions, H&P , NPO status , Patient's Chart, lab work & pertinent test results  Airway Mallampati: III  TM Distance: >3 FB Neck ROM: Full    Dental no notable dental hx. (+) Caps No caps in front teeth:   Pulmonary asthma , former smoker   Pulmonary exam normal breath sounds clear to auscultation       Cardiovascular negative cardio ROS Normal cardiovascular exam Rhythm:Regular Rate:Normal  05-26-24 EKG sinus rhythm with marked sinus arrhythmia   Neuro/Psych  Neuromuscular disease negative neurological ROS  negative psych ROS   GI/Hepatic negative GI ROS, Neg liver ROS,GERD  ,,  Endo/Other  negative endocrine ROS    Renal/GU negative Renal ROS  negative genitourinary   Musculoskeletal negative musculoskeletal ROS (+) Arthritis ,    Abdominal   Peds negative pediatric ROS (+)  Hematology negative hematology ROS (+)   Anesthesia Other Findings Hypogonadism in male  BPH with obstruction/lower urinary tract symptoms Vitamin D deficiency  Erectile dysfunction GERD (gastroesophageal reflux disease)  Neuromuscular disorder, nerve damage in arms from neck surgery PONV as a child  Reproductive/Obstetrics negative OB ROS                              Anesthesia Physical Anesthesia Plan  ASA: 2  Anesthesia Plan: General   Post-op Pain Management:    Induction: Intravenous  PONV Risk Score and Plan:   Airway Management Planned: Natural Airway and Nasal Cannula  Additional Equipment:   Intra-op Plan:   Post-operative Plan:   Informed Consent: I have reviewed the patients History and Physical, chart, labs and discussed the procedure including the risks, benefits and alternatives for the proposed anesthesia with the patient or authorized representative who has indicated  his/her understanding and acceptance.     Dental Advisory Given  Plan Discussed with: Anesthesiologist, CRNA and Surgeon  Anesthesia Plan Comments: (Patient consented for risks of anesthesia including but not limited to:  - adverse reactions to medications - risk of airway placement if required - damage to eyes, teeth, lips or other oral mucosa - nerve damage due to positioning  - sore throat or hoarseness - Damage to heart, brain, nerves, lungs, other parts of body or loss of life  Patient voiced understanding and assent.)         Anesthesia Quick Evaluation

## 2024-05-21 ENCOUNTER — Encounter: Payer: Self-pay | Admitting: Gastroenterology

## 2024-05-26 ENCOUNTER — Ambulatory Visit: Payer: Self-pay | Admitting: Anesthesiology

## 2024-05-26 ENCOUNTER — Encounter: Payer: Self-pay | Admitting: Gastroenterology

## 2024-05-26 ENCOUNTER — Ambulatory Visit
Admission: RE | Admit: 2024-05-26 | Discharge: 2024-05-26 | Disposition: A | Discharge status: Home / Self Care | Attending: Gastroenterology | Admitting: Gastroenterology

## 2024-05-26 ENCOUNTER — Encounter
Admission: RE | Disposition: A | Discharge status: Home / Self Care | Payer: Self-pay | Source: Home / Self Care | Attending: Gastroenterology

## 2024-05-26 ENCOUNTER — Other Ambulatory Visit: Payer: Self-pay

## 2024-05-26 DIAGNOSIS — Z09 Encounter for follow-up examination after completed treatment for conditions other than malignant neoplasm: Secondary | ICD-10-CM | POA: Diagnosis not present

## 2024-05-26 DIAGNOSIS — J45909 Unspecified asthma, uncomplicated: Secondary | ICD-10-CM | POA: Diagnosis not present

## 2024-05-26 DIAGNOSIS — Z860101 Personal history of adenomatous and serrated colon polyps: Secondary | ICD-10-CM | POA: Diagnosis not present

## 2024-05-26 DIAGNOSIS — K635 Polyp of colon: Secondary | ICD-10-CM | POA: Diagnosis not present

## 2024-05-26 DIAGNOSIS — Z87891 Personal history of nicotine dependence: Secondary | ICD-10-CM | POA: Diagnosis not present

## 2024-05-26 HISTORY — DX: Unspecified asthma, uncomplicated: J45.909

## 2024-05-26 HISTORY — DX: Other specified postprocedural states: Z98.890

## 2024-05-26 HISTORY — DX: Unspecified osteoarthritis, unspecified site: M19.90

## 2024-05-26 HISTORY — PX: POLYPECTOMY: SHX149

## 2024-05-26 HISTORY — PX: COLONOSCOPY: SHX5424

## 2024-05-26 HISTORY — DX: Personal history of urinary calculi: Z87.442

## 2024-05-26 SURGERY — COLONOSCOPY
Anesthesia: General

## 2024-05-26 MED ORDER — LIDOCAINE HCL (CARDIAC) PF 100 MG/5ML IV SOSY
PREFILLED_SYRINGE | INTRAVENOUS | Status: DC | PRN
  Administered 2024-05-26: 50 mg via INTRAVENOUS

## 2024-05-26 MED ORDER — PROPOFOL 10 MG/ML IV BOLUS
INTRAVENOUS | Status: AC
  Filled 2024-05-26: qty 40

## 2024-05-26 MED ORDER — STERILE WATER FOR IRRIGATION IR SOLN
Status: DC | PRN
  Administered 2024-05-26: 1

## 2024-05-26 MED ORDER — PROPOFOL 10 MG/ML IV BOLUS
INTRAVENOUS | Status: DC | PRN
  Administered 2024-05-26 (×2): 50 mg via INTRAVENOUS
  Administered 2024-05-26: 80 mg via INTRAVENOUS
  Administered 2024-05-26: 30 mg via INTRAVENOUS
  Administered 2024-05-26: 50 mg via INTRAVENOUS

## 2024-05-26 MED ORDER — LACTATED RINGERS IV SOLN: INTRAVENOUS | Status: DC

## 2024-05-26 MED ORDER — SODIUM CHLORIDE 0.9 % IV SOLN: INTRAVENOUS | Status: DC

## 2024-05-26 SURGICAL SUPPLY — 6 items
GOWN CVR UNV OPN BCK APRN NK (MISCELLANEOUS) ×4 IMPLANT
KIT PROCEDURE OLYMPUS (MISCELLANEOUS) ×2 IMPLANT
MANIFOLD NEPTUNE II (INSTRUMENTS) ×2 IMPLANT
SNARE COLD EXACTO (MISCELLANEOUS) IMPLANT
TRAP ETRAP POLY (MISCELLANEOUS) IMPLANT
WATER STERILE IRR 250ML POUR (IV SOLUTION) ×2 IMPLANT

## 2024-05-26 NOTE — Anesthesia Postprocedure Evaluation (Signed)
 Anesthesia Post Note  Patient: Craig [name]  Procedure(s) Performed: COLONOSCOPY POLYPECTOMY, INTESTINE  Patient location during evaluation: PACU Anesthesia Type: General Level of consciousness: awake and alert Pain management: pain level controlled Vital Signs Assessment: post-procedure vital signs reviewed and stable Respiratory status: spontaneous breathing, nonlabored ventilation, respiratory function stable and patient connected to nasal cannula oxygen Cardiovascular status: blood pressure returned to baseline and stable Postop Assessment: no apparent nausea or vomiting Anesthetic complications: no   No notable events documented.   Last Vitals:  Vitals:   05/26/24 1000 05/26/24 1012  BP: 95/60 117/70  Pulse: 67 65  Resp: 18 18  Temp:  36.6 C  SpO2: 96% 96%    Last Pain:  Vitals:   05/26/24 1012  TempSrc:   PainSc: 0-No pain                 [name]

## 2024-05-26 NOTE — Op Note (Signed)
 [facility] Patient Name: Craig [name] Procedure Date: 05/26/2024 9:29 AM MRN: [id number] Account #: [id number] Date of Birth: September 12, 1950 Admit Type: Outpatient Age: 73 Room: [facility] OR ROOM 01 Gender: Male Note Status: Finalized Instrument Name: Arvis [id number] Procedure:             Colonoscopy Indications:           Surveillance: Personal history of adenomatous polyps                         on last colonoscopy > 5 years ago, Last colonoscopy:                         June 2019 Providers:             Corinn Jess Brooklyn MD, MD Medicines:             General Anesthesia Complications:         No immediate complications. Estimated blood loss: None. Procedure:             Pre-Anesthesia Assessment:                        - Prior to the procedure, a History and Physical was                         performed, and patient medications and allergies were                         reviewed. The patient is competent. The risks and                         benefits of the procedure and the sedation options and                         risks were discussed with the patient. All questions                         were answered and informed consent was obtained.                         Patient identification and proposed procedure were                         verified by the physician, the nurse, the                         anesthesiologist, the anesthetist and the technician                         in the pre-procedure area in the procedure room in the                         endoscopy suite. Mental Status Examination: alert and                         oriented. Airway Examination: normal oropharyngeal                         airway and neck  mobility. Respiratory Examination:                         clear to auscultation. CV Examination: normal.                         Prophylactic Antibiotics: The patient does not require                         prophylactic  antibiotics. Prior Anticoagulants: The                         patient has taken no anticoagulant or antiplatelet                         agents. ASA Grade Assessment: II - A patient with mild                         systemic disease. After reviewing the risks and                         benefits, the patient was deemed in satisfactory                         condition to undergo the procedure. The anesthesia                         plan was to use general anesthesia. Immediately prior                         to administration of medications, the patient was                         re-assessed for adequacy to receive sedatives. The                         heart rate, respiratory rate, oxygen saturations,                         blood pressure, adequacy of pulmonary ventilation, and                         response to care were monitored throughout the                         procedure. The physical status of the patient was                         re-assessed after the procedure.                        After obtaining informed consent, the colonoscope was                         passed under direct vision. Throughout the procedure,                         the patient's blood pressure, pulse, and oxygen  saturations were monitored continuously. The                         Colonoscope was introduced through the anus and                         advanced to the the terminal ileum, with                         identification of the appendiceal orifice and IC                         valve. The colonoscopy was performed without                         difficulty. The patient tolerated the procedure well.                         The quality of the bowel preparation was evaluated                         using the BBPS [facility] Bowel Preparation Scale) with                         scores of: Right Colon = 3, Transverse Colon = 3 and                         Left Colon = 3 (entire  mucosa seen well with no                         residual staining, small fragments of stool or opaque                         liquid). The total BBPS score equals 9. The terminal                         ileum, ileocecal valve, appendiceal orifice, and                         rectum were photographed. Findings:      The perianal and digital rectal examinations were normal. Pertinent       negatives include normal sphincter tone and no palpable rectal lesions.      A 5 mm polyp was found in the ascending colon. The polyp was sessile.       The polyp was removed with a cold snare. Resection and retrieval were       complete. Estimated blood loss: none.      A few small-mouthed diverticula were found in the recto-sigmoid colon       and sigmoid colon.      Non-bleeding external hemorrhoids were found during retroflexion. The       hemorrhoids were small.      The terminal ileum appeared normal. Impression:            - One 5 mm polyp in the ascending colon, removed with                         a cold snare.  Resected and retrieved.                        - Diverticulosis in the recto-sigmoid colon and in the                         sigmoid colon.                        - Non-bleeding external hemorrhoids.                        - The examined portion of the ileum was normal. Recommendation:        - Discharge patient to home (with escort).                        - Resume previous diet today.                        - Continue present medications.                        - Await pathology results.                        - Repeat colonoscopy in 5 years for surveillance. Procedure Code(s):     --- Professional ---                        [phone], Colonoscopy, flexible; with removal of                         tumor(s), polyp(s), or other lesion(s) by snare                         technique Diagnosis Code(s):     --- Professional ---                        Z86.010, Personal history of colonic  polyps                        D12.2, Benign neoplasm of ascending colon                        K64.4, Residual hemorrhoidal skin tags                        K57.30, Diverticulosis of large intestine without                         perforation or abscess without bleeding CPT copyright 2022 American Medical Association. All rights reserved. The codes documented in this report are preliminary and upon coder review may  be revised to meet current compliance requirements. Dr. Corinn Brooklyn Corinn Jess Brooklyn MD, MD 05/26/2024 9:55:57 AM This report has been signed electronically. Number of Addenda: 0 Note Initiated On: 05/26/2024 9:29 AM Scope Withdrawal Time: 0 hours 9 minutes 30 seconds  Total Procedure Duration: 0 hours 11 minutes 9 seconds  Estimated Blood Loss:  Estimated blood loss: none. Estimated blood loss: none.      [facility]

## 2024-05-26 NOTE — Transfer of Care (Signed)
 Immediate Anesthesia Transfer of Care Note  Patient: Craig [name]  Procedure(s) Performed: COLONOSCOPY POLYPECTOMY, INTESTINE  Patient Location: PACU  Anesthesia Type: General  Level of Consciousness: awake, alert  and patient cooperative  Airway and Oxygen Therapy: Patient Spontanous Breathing and Patient connected to supplemental oxygen  Post-op Assessment: Post-op Vital signs reviewed, Patient's Cardiovascular Status Stable, Respiratory Function Stable, Patent Airway and No signs of Nausea or vomiting  Post-op Vital Signs: Reviewed and stable  Complications: No notable events documented.

## 2024-05-26 NOTE — H&P (Signed)
 Craig JONELLE Brooklyn, MD [facility]astroenter[facility], [address]  [place], [place] [address]  Main: [phone] Fax:  [phone] Pager: [phone]   Primary Care Physician:  Craig Cheryl BRAVO, MD Primary Gastroenterologist:  Dr. Corinn JONELLE [name]  Pre-Procedure History & Physical: HPI:  Craig [name] is a 73 y.o. male is here for an colonoscopy.   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   PONV (postoperative nausea and vomiting)    Vitamin D deficiency     Past Surgical History:  Procedure Laterality Date   ANTERIOR RELEASE VERTEBRAL BODY W/ POSTERIOR FUSION     APPENDECTOMY     CATARACT EXTRACTION W/PHACO Right 08/21/2017   Procedure: CATARACT EXTRACTION PHACO AND INTRAOCULAR LENS PLACEMENT (IOC) RIGHT;  Surgeon: Myrna Adine Anes, MD;  Location: [facility];  Service: Ophthalmology;  Laterality: Right;   CATARACT EXTRACTION W/PHACO Left 09/18/2017   Procedure: CATARACT EXTRACTION PHACO AND INTRAOCULAR LENS PLACEMENT (IOC) LEFT;  Surgeon: Myrna Adine Anes, MD;  Location: [facility];  Service: Ophthalmology;  Laterality: Left;   COLONOSCOPY WITH PROPOFOL  N/A 12/06/2017   Procedure: COLONOSCOPY WITH PROPOFOL ;  Surgeon: Jinny Carmine, MD;  Location: [facility];  Service: Endoscopy;  Laterality: N/A;   HERNIA REPAIR  1975/1960   LEG SURGERY Right 2012   NECK SURGERY     POLYPECTOMY N/A 12/06/2017   Procedure: POLYPECTOMY;  Surgeon: Jinny Carmine, MD;  Location: [facility];  Service: Endoscopy;  Laterality: N/A;   ruptured disc     VARICOCELECTOMY      Prior to Admission medications   Medication Sig Start Date End Date Taking? Authorizing  Provider  omeprazole (PRILOSEC) 20 MG capsule Take 20 mg by mouth daily.   Yes [provider]  tamsulosin  (FLOMAX ) 0.4 MG CAPS capsule Take 1 capsule (0.4 mg total) by mouth daily. 03/03/24  Yes McGowan, Clotilda A, PA-C  ibuprofen (ADVIL,MOTRIN) 200 MG tablet Take 200 mg by mouth 2 (two) times daily. 3 in am 2 at night    [provider]  tadalafil  (CIALIS ) 5 MG tablet Take 1 tablet (5 mg total) by mouth daily as needed for erectile dysfunction. 03/03/24   Helon Clotilda A, PA-C    Allergies as of 05/14/2024 - Review Complete 03/03/2024  Allergen Reaction Noted   Ivp dye [iodinated contrast media] Anaphylaxis 05/11/2017    Family History  Problem Relation Age of Onset   Kidney cancer Neg Hx    Prostate cancer Neg Hx    Kidney disease Neg Hx     Social History   Socioeconomic History   Marital status: Single    Spouse name: Not on file   Number of children: Not on file   Years of education: Not on file   Highest education level: Not on file  Occupational History   Not on file  Tobacco Use   Smoking status: Former    Current packs/day: 0.00    Types: Cigarettes    Quit date: 06/26/1973    Years since quitting: 50.9   Smokeless tobacco: Never   Tobacco comments:    quit 40 years  Vaping Use   Vaping status: Never Used  Substance and Sexual Activity   Alcohol use: Yes    Comment: occasional   Drug use: No   Sexual activity: Not on file  Other Topics Concern   Not on file  Social History Narrative   Not on file   Social Drivers of Health   Financial Resource Strain: Low Risk  (05/13/2024)   Received from [facility]   Overall Financial Resource Strain (CARDIA)    Difficulty of Paying Living Expenses: Not very hard  Food Insecurity: No Food Insecurity (05/13/2024)   Received from [facility]   Hunger Vital Sign    Within the past 12 months, you worried that your food would run out before you got the money to buy  more.: Never true    Within the past 12 months, the food you bought just didn't last and you didn't have money to get more.: Never true  Transportation Needs: No Transportation Needs (05/13/2024)   Received from [facility] - Transportation    In the past 12 months, has lack of transportation kept you from medical appointments or from getting medications?: No    Lack of Transportation (Non-Medical): No  Physical Activity: Insufficiently Active (11/28/2022)   Received from [facility]   Exercise Vital Sign    On average, how many days per week do you engage in moderate to strenuous exercise (like a brisk walk)?: 1 day    On average, how many minutes do you engage in exercise at this level?: 50 min  Stress: No Stress Concern Present (11/28/2022)   Received from [facility] - Occupational Stress Questionnaire    Feeling of Stress : Not at all  Social Connections: Not on file  Intimate Partner Violence: Not on file    Review of Systems: See HPI, otherwise negative ROS  Physical Exam: BP (!) 148/67   Pulse 86   Temp 98.3 F (36.8 C) (Temporal)   Resp 15   Ht 5' 8 (1.727 m)   Wt 87.5 kg   SpO2 96%   BMI 29.35 kg/m  General:   Alert,  pleasant and cooperative in NAD Head:  Normocephalic and atraumatic. Neck:  Supple; no masses or thyromegaly. Lungs:  Clear throughout to auscultation.    Heart:  Regular rate and rhythm. Abdomen:  Soft, nontender and nondistended. Normal bowel sounds, without guarding, and without rebound.   Neurologic:  Alert and  oriented x4;  grossly normal neurologically.  Impression/Plan: Craig [name] is here for an colonoscopy to be performed for h/o colon polyp  Risks, benefits, limitations, and alternatives regarding  colonoscopy have been reviewed with the patient.  Questions have been answered.  All parties agreeable.   Craig Brooklyn, MD   05/26/2024, 9:31 AM

## 2024-05-27 LAB — SURGICAL PATHOLOGY

## 2024-05-30 ENCOUNTER — Ambulatory Visit: Payer: Self-pay | Admitting: Gastroenterology

## 2025-02-10 ENCOUNTER — Ambulatory Visit: Admitting: Dermatology

## 2025-02-23 ENCOUNTER — Other Ambulatory Visit

## 2025-03-09 ENCOUNTER — Ambulatory Visit: Admitting: Urology
